# Patient Record
Sex: Female | Born: 1945 | Race: White | Hispanic: No | Marital: Married | Smoking: Never smoker
Health system: Southern US, Community
[De-identification: ages and names within clinical notes are randomized; demographics above are authoritative.]

## PROBLEM LIST (undated history)

## (undated) DIAGNOSIS — I10 Essential (primary) hypertension: Secondary | ICD-10-CM

## (undated) HISTORY — PX: JOINT REPLACEMENT: SHX530

---

## 2004-02-04 ENCOUNTER — Ambulatory Visit: Payer: Self-pay | Admitting: Psychology

## 2004-02-28 ENCOUNTER — Ambulatory Visit: Payer: Self-pay | Admitting: Psychology

## 2008-07-09 ENCOUNTER — Emergency Department (HOSPITAL_BASED_OUTPATIENT_CLINIC_OR_DEPARTMENT_OTHER): Admission: EM | Admit: 2008-07-09 | Discharge: 2008-07-09 | Payer: Self-pay | Admitting: Emergency Medicine

## 2010-09-23 LAB — DIFFERENTIAL
Basophils Absolute: 0.1 10*3/uL (ref 0.0–0.1)
Eosinophils Absolute: 0.2 10*3/uL (ref 0.0–0.7)
Eosinophils Relative: 2 % (ref 0–5)
Lymphocytes Relative: 38 % (ref 12–46)
Monocytes Absolute: 0.4 10*3/uL (ref 0.1–1.0)

## 2010-09-23 LAB — URINE CULTURE

## 2010-09-23 LAB — URINALYSIS, ROUTINE W REFLEX MICROSCOPIC
Ketones, ur: 15 mg/dL — AB
Nitrite: POSITIVE — AB
Protein, ur: >300 mg/dL — AB
Urobilinogen, UA: 1 mg/dL (ref 0.0–1.0)

## 2010-09-23 LAB — BASIC METABOLIC PANEL
BUN: 13 mg/dL (ref 6–23)
CO2: 28 mEq/L (ref 19–32)
Chloride: 102 mEq/L (ref 96–112)
GFR calc non Af Amer: >60 mL/min (ref >60)
Glucose, Bld: 102 mg/dL — ABNORMAL HIGH (ref 70–99)
Potassium: 4.2 mEq/L (ref 3.5–5.1)
Sodium: 141 mEq/L (ref 135–145)

## 2010-09-23 LAB — CBC
HCT: 39.2 % (ref 36.0–46.0)
Hemoglobin: 13.6 g/dL (ref 12.0–15.0)
MCV: 93.7 fL (ref 78.0–100.0)
RDW: 12.6 % (ref 11.5–15.5)

## 2010-10-27 ENCOUNTER — Emergency Department (INDEPENDENT_AMBULATORY_CARE_PROVIDER_SITE_OTHER): Payer: Managed Care, Other (non HMO)

## 2010-10-27 ENCOUNTER — Emergency Department (HOSPITAL_BASED_OUTPATIENT_CLINIC_OR_DEPARTMENT_OTHER)
Admission: EM | Admit: 2010-10-27 | Discharge: 2010-10-27 | Disposition: A | Discharge status: Home / Self Care | Payer: Managed Care, Other (non HMO) | Attending: Emergency Medicine | Admitting: Emergency Medicine

## 2010-10-27 DIAGNOSIS — M25579 Pain in unspecified ankle and joints of unspecified foot: Secondary | ICD-10-CM

## 2010-10-27 DIAGNOSIS — W098XXA Fall on or from other playground equipment, initial encounter: Secondary | ICD-10-CM

## 2010-10-27 DIAGNOSIS — W1789XA Other fall from one level to another, initial encounter: Secondary | ICD-10-CM | POA: Insufficient documentation

## 2010-10-27 DIAGNOSIS — G4730 Sleep apnea, unspecified: Secondary | ICD-10-CM | POA: Insufficient documentation

## 2010-10-27 DIAGNOSIS — F329 Major depressive disorder, single episode, unspecified: Secondary | ICD-10-CM | POA: Insufficient documentation

## 2010-10-27 DIAGNOSIS — I10 Essential (primary) hypertension: Secondary | ICD-10-CM | POA: Insufficient documentation

## 2010-10-27 DIAGNOSIS — S93409A Sprain of unspecified ligament of unspecified ankle, initial encounter: Secondary | ICD-10-CM | POA: Insufficient documentation

## 2010-10-27 DIAGNOSIS — F3289 Other specified depressive episodes: Secondary | ICD-10-CM | POA: Insufficient documentation

## 2011-06-11 ENCOUNTER — Other Ambulatory Visit: Payer: Self-pay

## 2011-06-11 ENCOUNTER — Emergency Department (HOSPITAL_BASED_OUTPATIENT_CLINIC_OR_DEPARTMENT_OTHER)
Admission: EM | Admit: 2011-06-11 | Discharge: 2011-06-12 | Disposition: A | Discharge status: Home / Self Care | Payer: Managed Care, Other (non HMO) | Attending: Emergency Medicine | Admitting: Emergency Medicine

## 2011-06-11 ENCOUNTER — Emergency Department (HOSPITAL_COMMUNITY): Payer: Managed Care, Other (non HMO)

## 2011-06-11 ENCOUNTER — Encounter: Payer: Self-pay | Admitting: *Deleted

## 2011-06-11 ENCOUNTER — Emergency Department (INDEPENDENT_AMBULATORY_CARE_PROVIDER_SITE_OTHER): Payer: Managed Care, Other (non HMO)

## 2011-06-11 DIAGNOSIS — R110 Nausea: Secondary | ICD-10-CM

## 2011-06-11 DIAGNOSIS — F3289 Other specified depressive episodes: Secondary | ICD-10-CM | POA: Insufficient documentation

## 2011-06-11 DIAGNOSIS — Z79899 Other long term (current) drug therapy: Secondary | ICD-10-CM | POA: Insufficient documentation

## 2011-06-11 DIAGNOSIS — R930 Abnormal findings on diagnostic imaging of skull and head, not elsewhere classified: Secondary | ICD-10-CM

## 2011-06-11 DIAGNOSIS — I10 Essential (primary) hypertension: Secondary | ICD-10-CM | POA: Insufficient documentation

## 2011-06-11 DIAGNOSIS — R42 Dizziness and giddiness: Secondary | ICD-10-CM

## 2011-06-11 DIAGNOSIS — F329 Major depressive disorder, single episode, unspecified: Secondary | ICD-10-CM | POA: Insufficient documentation

## 2011-06-11 HISTORY — DX: Essential (primary) hypertension: I10

## 2011-06-11 LAB — DIFFERENTIAL
Basophils Relative: 0 % (ref 0–1)
Eosinophils Relative: 0 % (ref 0–5)
Lymphocytes Relative: 17 % (ref 12–46)
Neutrophils Relative %: 79 % — ABNORMAL HIGH (ref 43–77)

## 2011-06-11 LAB — BASIC METABOLIC PANEL
Chloride: 102 mEq/L (ref 96–112)
GFR calc Af Amer: >90 mL/min (ref >90)
GFR calc non Af Amer: >90 mL/min (ref >90)
Glucose, Bld: 145 mg/dL — ABNORMAL HIGH (ref 70–99)
Potassium: 4.2 mEq/L (ref 3.5–5.1)
Sodium: 141 mEq/L (ref 135–145)

## 2011-06-11 LAB — CBC
Hemoglobin: 14.2 g/dL (ref 12.0–15.0)
RBC: 4.54 MIL/uL (ref 3.87–5.11)

## 2011-06-11 MED ORDER — MECLIZINE HCL 25 MG PO TABS: 25.0000 mg | ORAL_TABLET | Freq: Three times a day (TID) | ORAL | Status: AC | PRN

## 2011-06-11 MED ORDER — LORAZEPAM 2 MG/ML IJ SOLN
1.0000 mg | Freq: Once | INTRAMUSCULAR | Status: AC
  Administered 2011-06-11: 1 mg via INTRAVENOUS
  Filled 2011-06-11: qty 1

## 2011-06-11 MED ORDER — ONDANSETRON HCL 4 MG/2ML IJ SOLN
4.0000 mg | Freq: Once | INTRAMUSCULAR | Status: AC
  Administered 2011-06-11: 4 mg via INTRAVENOUS
  Filled 2011-06-11: qty 2

## 2011-06-11 MED ORDER — SODIUM CHLORIDE 0.9 % IV BOLUS (SEPSIS)
1000.0000 mL | Freq: Once | INTRAVENOUS | Status: AC
  Administered 2011-06-11: 1000 mL via INTRAVENOUS

## 2011-06-11 MED ORDER — MECLIZINE HCL 25 MG PO TABS
25.0000 mg | ORAL_TABLET | Freq: Once | ORAL | Status: AC
  Administered 2011-06-11: 25 mg via ORAL
  Filled 2011-06-11: qty 1

## 2011-06-11 NOTE — ED Notes (Signed)
Pt. Reports not eating normal and having no appetite.

## 2011-06-11 NOTE — ED Notes (Signed)
Pt. Reports being treated for an upper resp. Infection twice in Dec.

## 2011-06-11 NOTE — ED Notes (Signed)
Pt. Husband at bedside.

## 2011-06-11 NOTE — ED Notes (Signed)
Patient transported to MRI 

## 2011-06-11 NOTE — ED Notes (Signed)
CBG with EMS is 126

## 2011-06-11 NOTE — ED Notes (Signed)
Pt stated that she is anxious about the MRI. Denise Blazing, NP stated to give her 1mg  Ativan IV prior to MRI. Will continue to monitor.

## 2011-06-11 NOTE — ED Notes (Signed)
[facility] is here for the Pt.

## 2011-06-11 NOTE — ED Notes (Signed)
Family at bedside. 

## 2011-06-11 NOTE — ED Notes (Signed)
Pt. Has gone for CT scan

## 2011-06-11 NOTE — ED Notes (Signed)
Pt. Reports feeling better after having some crackers and cola.

## 2011-06-11 NOTE — ED Provider Notes (Signed)
Patient transferred in from [facility] for MRI to r/o stroke. CT performed earlier today had questionable finding in right occipital lobe.  Patient resting comfortably with family at bedside.  Currently symptom free.  Lungs CTA bilaterally.S1/S2, RRR, no murmur. Abdomen soft, bowel sounds present.  MOEX4, equal grip strength b/l.   11:50 PM  MRI results reviewed and discussed with patient.  No evidence of stroke.  Patient will be discharged home with rx for meclizine and follow-up with her PCP in [facility].    Jimmye Norman, NP 06/11/11 2351

## 2011-06-11 NOTE — ED Notes (Signed)
Returned from mri 

## 2011-06-11 NOTE — ED Notes (Signed)
Pt. Has been seen by EDP

## 2011-06-11 NOTE — ED Notes (Signed)
NP, Katrinka Blazing stated that  It was okay for patient to have saltines and a drink. Both given to pt.

## 2011-06-11 NOTE — ED Provider Notes (Addendum)
History     CSN: [id number]  Arrival date & time 06/11/11  1615   First MD Initiated Contact with Patient 06/11/11 1657      Chief Complaint  Patient presents with  . Nausea  . Dizziness    (Consider location/radiation/quality/duration/timing/severity/associated sxs/prior treatment) HPI  65yoF h/o depression, hypertension present with lightheadedness. The patient states she woke up this morning feeling lightheaded. She denies vertigo. She complains of nausea. She states that sometimes it is worse with head movement and other times it is not. She states that prior to arrival she had a presyncopal episode. She did not actually pass out. She denies recent head trauma or injury. She denies numbness, tingling, weakness of her extremity. She does not feel off balance. She denies fevers, chills, neck stiffness. She was recently treated for bronchitis. She denies pain in her ears. She denies chest pain, shortness of breath, abdominal pain. She is not having diarrhea or constipation. Her last bowel movement was 2 days ago. Per EMS her blood glucose was 126 prior to arrival. The patient does not have a history of cancer but she does report a 10-15 pound weight loss over the past 4 months. There've been no night sweats. No h/o similar events. States she did not eat today.   Past Medical History  Diagnosis Date  . Hypertension     Past Surgical History  Procedure Date  . Joint replacement     R knee    No family history on file.  History  Substance Use Topics  . Smoking status: Not on file  . Smokeless tobacco: Not on file  . Alcohol Use:     OB History    Grav Para Term Preterm Abortions TAB SAB Ect Mult Living                  Review of Systems  All other systems reviewed and are negative.  except as noted HPI   Allergies  Review of patient's allergies indicates no known allergies.  Home Medications   Current Outpatient Rx  Name Route Sig Dispense Refill  . ALPRAZOLAM  0.5 MG PO TABS Oral Take 0.5 mg by mouth at bedtime as needed. For sleep    . AMLODIPINE BESYLATE 5 MG PO TABS Oral Take 5 mg by mouth daily.      . DESVENLAFAXINE SUCCINATE ER 50 MG PO TB24 Oral Take 50 mg by mouth daily.      Marland Kitchen GABAPENTIN 100 MG PO CAPS Oral Take 100 mg by mouth daily.     Marland Kitchen OMEPRAZOLE 20 MG PO CPDR Oral Take 20 mg by mouth daily.      Marland Kitchen ONDANSETRON 4 MG PO TBDP Oral Take 4 mg by mouth every 8 (eight) hours as needed. For nausea    . PREDNISONE (PAK) 10 MG PO TABS Oral Take 10 mg by mouth daily.      Marland Kitchen ROPINIROLE HCL 0.5 MG PO TABS Oral Take 0.5 mg by mouth daily as needed. For restless legs    . TRAMADOL HCL 50 MG PO TABS Oral Take 50 mg by mouth every 6 (six) hours as needed. For pain      BP 158/89  Pulse 69  Temp(Src) 98.1 F (36.7 C) (Oral)  Resp 18  Ht 5\' 3"  (1.6 m)  Wt 195 lb (88.451 kg)  BMI 34.54 kg/m2  SpO2 100%  Physical Exam  Nursing note and vitals reviewed. Constitutional: She is oriented to person, place, and time. She  appears well-developed.  HENT:  Head: Atraumatic.  Mouth/Throat: Oropharynx is clear and moist.       TM clear b/l  Eyes: Conjunctivae and EOM are normal. Pupils are equal, round, and reactive to light.  Neck: Normal range of motion. Neck supple.  Cardiovascular: Normal rate, regular rhythm, normal heart sounds and intact distal pulses.   Pulmonary/Chest: Effort normal and breath sounds normal. No respiratory distress. She has no wheezes. She has no rales.  Abdominal: Soft. She exhibits no distension. There is no tenderness. There is no rebound and no guarding.  Musculoskeletal: Normal range of motion.  Neurological: She is alert and oriented to person, place, and time. No cranial nerve deficit. She exhibits normal muscle tone. Coordination normal.       Strength 5/5 all extremities No pronator drift No facial droop  F--> n intact H--> shin intact Romberg NEGATIVE  Skin: Skin is warm and dry. No rash noted.  Psychiatric: She  has a normal mood and affect.    Date: 06/11/2011  Rate: 59  Rhythm: sinus bradycardia  QRS Axis: normal  Intervals: normal  ST/T Wave abnormalities: normal  Conduction Disutrbances:none  Narrative Interpretation:   Old EKG Reviewed: none available  ED Course  Procedures (including critical care time)  Labs Reviewed  BASIC METABOLIC PANEL - Abnormal; Notable for the following:    Glucose, Bld 145 (*)    All other components within normal limits  CBC - Abnormal; Notable for the following:    Platelets 417 (*)    All other components within normal limits  DIFFERENTIAL - Abnormal; Notable for the following:    Neutrophils Relative 79 (*)    All other components within normal limits  URINALYSIS, ROUTINE W REFLEX MICROSCOPIC   Ct Head Wo Contrast  06/11/2011  *RADIOLOGY REPORT*  Clinical Data: Vertigo, nausea  CT HEAD WITHOUT CONTRAST  Technique:  Contiguous axial images were obtained from the base of the skull through the vertex without contrast.  Comparison: None  Findings: Minimal atrophy. Normal ventricular morphology. No midline shift or mass effect. Streak artifacts from skull base traverse pons and brain stem. Questionable area of low attenuation at right occipital lobe, could represent artifact but subtle infarct not excluded. No intracranial hemorrhage, mass lesion or additional areas of potential infarction. Small amount of fluid within the maxillary sinuses bilaterally. Minimal scattered mucosal thickening ethmoid air cells. No acute osseous findings.  IMPRESSION: Question artifact at right occipital lobe versus subtle infarct. No other intracranial abnormalities identified.  Original Report Authenticated By: Lollie Marrow, M.D.     1. Lightheadedness   2. Nausea     MDM  The patient presents with lightheadedness and nausea. Her neurological exam is completely intact. No cerebellar findings. She does report recent weight loss over the past few months without trying. CT head  was ordered to evaluate for mass lesion. The results are as above. Question artifact versus subtle infarct of right occipital lobe. I hvae a very low suspicion that this is causing the patient's symptoms however we'll send over to [facility] for further evaluation with an MRI of her head. The patient is actually feeling better with IV fluids, meclizine. She passed a bedside swallow test and is tolerating by mouth. If her MRI is negative anticipate she can be discharged home with primary care followup, meclizine for possible peripheral etiology. May be related to her recent likely viral URI/bronchitis.  Discussed transfer with Dr. Juleen China at [facility]. D/W CDU NP--aware of transfer and  plan.    Forbes Cellar, MD 06/11/11 2057

## 2011-06-11 NOTE — ED Notes (Signed)
Pt. Has husband here with her and was noted with no active vomiting

## 2011-06-11 NOTE — ED Notes (Addendum)
Pt  Just transferred per EMS from [facility]. Per pt she has been lightheaded and nauseated all day. She started having a cold sensation, so she called EMS to come take her to the hospital. She did not have any SOB, CP, or neurological deficits. Per EMS, when she arrived the [facility], a CT was performed. The CT recommended that she receive a MRI, so that is why she was transferred here. Currently, pt is not in any respiratory distress and does not have any neurological deficits. Pt is still complaining of dizziness with standing. Will continue to monitor.

## 2011-06-20 NOTE — ED Provider Notes (Signed)
Medical screening examination/treatment/procedure(s) were performed by non-physician practitioner and as supervising physician I was immediately available for consultation/collaboration.  Raeford Razor, MD 06/20/11 2244

## 2014-01-06 ENCOUNTER — Emergency Department (HOSPITAL_BASED_OUTPATIENT_CLINIC_OR_DEPARTMENT_OTHER)
Admission: EM | Admit: 2014-01-06 | Discharge: 2014-01-06 | Discharge status: Against Medical Advice | Payer: Managed Care, Other (non HMO)

## 2017-09-12 ENCOUNTER — Encounter (HOSPITAL_BASED_OUTPATIENT_CLINIC_OR_DEPARTMENT_OTHER): Payer: Self-pay | Admitting: Adult Health

## 2017-09-12 ENCOUNTER — Other Ambulatory Visit: Payer: Self-pay

## 2017-09-12 ENCOUNTER — Emergency Department (HOSPITAL_BASED_OUTPATIENT_CLINIC_OR_DEPARTMENT_OTHER)
Admission: EM | Admit: 2017-09-12 | Discharge: 2017-09-12 | Disposition: A | Discharge status: Home / Self Care | Payer: Medicare Other | Attending: Emergency Medicine | Admitting: Emergency Medicine

## 2017-09-12 DIAGNOSIS — N390 Urinary tract infection, site not specified: Secondary | ICD-10-CM | POA: Diagnosis not present

## 2017-09-12 DIAGNOSIS — Z79899 Other long term (current) drug therapy: Secondary | ICD-10-CM | POA: Diagnosis not present

## 2017-09-12 DIAGNOSIS — Z96651 Presence of right artificial knee joint: Secondary | ICD-10-CM | POA: Insufficient documentation

## 2017-09-12 DIAGNOSIS — I10 Essential (primary) hypertension: Secondary | ICD-10-CM | POA: Insufficient documentation

## 2017-09-12 DIAGNOSIS — R319 Hematuria, unspecified: Secondary | ICD-10-CM | POA: Diagnosis present

## 2017-09-12 LAB — URINALYSIS, ROUTINE W REFLEX MICROSCOPIC
BILIRUBIN URINE: NEGATIVE
GLUCOSE, UA: 100 mg/dL — AB
KETONES UR: 15 mg/dL — AB
Nitrite: POSITIVE — AB
Specific Gravity, Urine: 1.02 (ref 1.005–1.030)
pH: 6.5 (ref 5.0–8.0)

## 2017-09-12 LAB — URINALYSIS, MICROSCOPIC (REFLEX)

## 2017-09-12 MED ORDER — CEPHALEXIN 500 MG PO CAPS: 500.0000 mg | ORAL_CAPSULE | Freq: Two times a day (BID) | ORAL | 0 refills | Status: AC

## 2017-09-12 NOTE — ED Provider Notes (Signed)
[facility] EMERGENCY DEPARTMENT Provider Note   CSN: [id number] Arrival date & time: 09/12/17  2013     History   Chief Complaint Chief Complaint  Patient presents with  . Urinary Tract Infection    HPI Denise Little is a 72 y.o. female.  Patient is a 72 year old female who presents with symptoms consistent with her prior urinary tract infections.  She has a 2-day history of some pressure in her lower abdomen and pressure on urination.  She also notes some hematuria which she has had consistently with her prior urinary tract infections.  She denies any flank pain.  No nausea or vomiting.  No fevers.  No pain on urination.  She states that she typically gets about 2 UTIs a year.  She is currently followed by her primary care physician.     Past Medical History:  Diagnosis Date  . Hypertension     There are no active problems to display for this patient.   Past Surgical History:  Procedure Laterality Date  . JOINT REPLACEMENT     R knee     OB History   None      Home Medications    Prior to Admission medications   Medication Sig Start Date End Date Taking? Authorizing Provider  ALPRAZolam Prudy Feeler(XANAX) 0.5 MG tablet Take 0.5 mg by mouth at bedtime as needed. For sleep    [provider]  amLODipine (NORVASC) 5 MG tablet Take 5 mg by mouth daily.      [provider]  cephALEXin (KEFLEX) 500 MG capsule Take 1 capsule (500 mg total) by mouth 2 (two) times daily. 09/12/17   Rolan BuccoBelfi, Victoriano Campion, MD  desvenlafaxine (PRISTIQ) 50 MG 24 hr tablet Take 50 mg by mouth daily.      [provider]  gabapentin (NEURONTIN) 100 MG capsule Take 100 mg by mouth daily.     [provider]  omeprazole (PRILOSEC) 20 MG capsule Take 20 mg by mouth daily.      [provider]  ondansetron (ZOFRAN-ODT) 4 MG disintegrating tablet Take 4 mg by mouth every 8 (eight) hours as needed. For nausea    [provider]  predniSONE (STERAPRED  UNI-PAK) 10 MG tablet Take 10 mg by mouth daily.      [provider]  rOPINIRole (REQUIP) 0.5 MG tablet Take 0.5 mg by mouth daily as needed. For restless legs    [provider]  traMADol (ULTRAM) 50 MG tablet Take 50 mg by mouth every 6 (six) hours as needed. For pain    [provider]    Family History History reviewed. No pertinent family history.  Social History Social History   Tobacco Use  . Smoking status: Not on file  Substance Use Topics  . Alcohol use: Not on file  . Drug use: Not on file     Allergies   Patient has no known allergies.   Review of Systems Review of Systems  Constitutional: Negative for chills, diaphoresis, fatigue and fever.  HENT: Negative for congestion, rhinorrhea and sneezing.   Eyes: Negative.   Respiratory: Negative for cough, chest tightness and shortness of breath.   Cardiovascular: Negative for chest pain and leg swelling.  Gastrointestinal: Negative for abdominal pain, blood in stool, diarrhea, nausea and vomiting.  Genitourinary: Positive for hematuria and urgency. Negative for difficulty urinating, flank pain and frequency.  Musculoskeletal: Negative for arthralgias and back pain.  Skin: Negative for rash.  Neurological: Negative for dizziness, speech  difficulty, weakness, numbness and headaches.     Physical Exam Updated Vital Signs BP (!) 163/74 (BP Location: Right Arm)   Pulse 79   Temp 98.2 F (36.8 C) (Oral)   Resp 18   SpO2 98%   Physical Exam  Constitutional: She is oriented to person, place, and time. She appears well-developed and well-nourished.  HENT:  Head: Normocephalic and atraumatic.  Eyes: Pupils are equal, round, and reactive to light.  Neck: Normal range of motion. Neck supple.  Cardiovascular: Normal rate, regular rhythm and normal heart sounds.  Pulmonary/Chest: Effort normal and breath sounds normal. No respiratory distress. She has no wheezes. She has no rales. She exhibits  no tenderness.  Abdominal: Soft. Bowel sounds are normal. There is tenderness (Mild tenderness to the suprapubic area). There is no rebound and no guarding.  Musculoskeletal: Normal range of motion. She exhibits no edema.  Lymphadenopathy:    She has no cervical adenopathy.  Neurological: She is alert and oriented to person, place, and time.  Skin: Skin is warm and dry. No rash noted.  Psychiatric: She has a normal mood and affect.     ED Treatments / Results  Labs (all labs ordered are listed, but only abnormal results are displayed) Labs Reviewed  URINALYSIS, ROUTINE W REFLEX MICROSCOPIC - Abnormal; Notable for the following components:      Result Value   Color, Urine RED (*)    APPearance CLOUDY (*)    Glucose, UA 100 (*)    Hgb urine dipstick LARGE (*)    Ketones, ur 15 (*)    Protein, ur >300 (*)    Nitrite POSITIVE (*)    Leukocytes, UA SMALL (*)    All other components within normal limits  URINALYSIS, MICROSCOPIC (REFLEX) - Abnormal; Notable for the following components:   Bacteria, UA MANY (*)    Squamous Epithelial / LPF 0-5 (*)    All other components within normal limits  URINE CULTURE    EKG None  Radiology No results found.  Procedures Procedures (including critical care time)  Medications Ordered in ED Medications - No data to display   Initial Impression / Assessment and Plan / ED Course  I have reviewed the triage vital signs and the nursing notes.  Pertinent labs & imaging results that were available during my care of the patient were reviewed by me and considered in my medical decision making (see chart for details).     Patient's urine is consistent with infection.  There is hematuria but she does not have clinical symptoms that would be more consistent with renal colic.  I reviewed her prior cultures and her last one grew out E. coli which was pansensitive.  She was started on Keflex.  Her urine culture was sent.  She was encouraged to  follow-up with her PCP.  Return precautions were given.  Final Clinical Impressions(s) / ED Diagnoses   Final diagnoses:  Lower urinary tract infectious disease    ED Discharge Orders        Ordered    cephALEXin (KEFLEX) 500 MG capsule  2 times daily     09/12/17 2243       Rolan Bucco, MD 09/12/17 2256

## 2017-09-12 NOTE — ED Triage Notes (Addendum)
Presents with hematuria that began one hour ago. SHe reprots hx of UTIs and this feels the same. Denies CVA tenderness. Denies fevers. Endorses dysuria and frequency

## 2017-09-15 LAB — URINE CULTURE: Culture: >=100000 — AB

## 2017-09-16 ENCOUNTER — Telehealth: Payer: Self-pay | Admitting: *Deleted

## 2017-09-16 NOTE — Telephone Encounter (Signed)
Post ED Visit - Positive Culture Follow-up  Culture report reviewed by antimicrobial stewardship pharmacist:  []  Enzo BiNathan Batchelder, Pharm.D. []  Celedonio MiyamotoJeremy Frens, Pharm.D., BCPS AQ-ID []  Garvin FilaMike Maccia, Pharm.D., BCPS []  Georgina PillionElizabeth Martin, Pharm.D., BCPS []  Wofford HeightsMinh Pham, VermontPharm.D., BCPS, AAHIVP []  Estella HuskMichelle Turner, Pharm.D., BCPS, AAHIVP []  Lysle Pearlachel Rumbarger, PharmD, BCPS []  Blake DivineShannon Parkey, PharmD []  Pollyann SamplesAndy Johnston, PharmD, BCPS Sharin MonsEmily Sinclair, PharmD  Positive urine culture Treated with Cephalexin, organism sensitive to the same and no further patient follow-up is required at this time.  Virl AxeRobertson, Reginaldo Hazard [facility] Hospitalalley 09/16/2017, 11:03 AM

## 2018-05-05 ENCOUNTER — Emergency Department (HOSPITAL_BASED_OUTPATIENT_CLINIC_OR_DEPARTMENT_OTHER): Payer: Medicare Other

## 2018-05-05 ENCOUNTER — Other Ambulatory Visit: Payer: Self-pay

## 2018-05-05 ENCOUNTER — Encounter (HOSPITAL_BASED_OUTPATIENT_CLINIC_OR_DEPARTMENT_OTHER): Payer: Self-pay | Admitting: *Deleted

## 2018-05-05 ENCOUNTER — Emergency Department (HOSPITAL_BASED_OUTPATIENT_CLINIC_OR_DEPARTMENT_OTHER)
Admission: EM | Admit: 2018-05-05 | Discharge: 2018-05-05 | Disposition: A | Discharge status: Home / Self Care | Payer: Medicare Other | Attending: Emergency Medicine | Admitting: Emergency Medicine

## 2018-05-05 DIAGNOSIS — I10 Essential (primary) hypertension: Secondary | ICD-10-CM | POA: Diagnosis not present

## 2018-05-05 DIAGNOSIS — R05 Cough: Secondary | ICD-10-CM | POA: Insufficient documentation

## 2018-05-05 DIAGNOSIS — Z96651 Presence of right artificial knee joint: Secondary | ICD-10-CM | POA: Diagnosis not present

## 2018-05-05 DIAGNOSIS — R059 Cough, unspecified: Secondary | ICD-10-CM

## 2018-05-05 DIAGNOSIS — Z79899 Other long term (current) drug therapy: Secondary | ICD-10-CM | POA: Diagnosis not present

## 2018-05-05 MED ORDER — BENZONATATE 100 MG PO CAPS: 100.0000 mg | ORAL_CAPSULE | Freq: Three times a day (TID) | ORAL | 0 refills | Status: AC

## 2018-05-05 MED ORDER — FLUTICASONE PROPIONATE 50 MCG/ACT NA SUSP: 1.0000 | Freq: Every day | NASAL | 0 refills | Status: AC

## 2018-05-05 NOTE — ED Triage Notes (Signed)
Cough since October. Her MD thinks she has reflux. She was referred to an ENT. States she is scheduled to go tomorrow but has to work and can't go. States she needs a cough medication for night time.

## 2018-05-05 NOTE — ED Provider Notes (Signed)
[facility] EMERGENCY DEPARTMENT Provider Note   CSN: [id number] Arrival date & time: 05/05/18  1718     History   Chief Complaint Chief Complaint  Patient presents with  . Cough    HPI Denise Little is a 72 y.o. female with a hx of HTN who presents to the ED with complaints of cough x 2 months. Patient states cough is dry, occasionally with production of clear sputum. Constant since onset. Seen by PCP- queried GERD as etiology- increased omeprazole dose without improvement, ultimately referred to ENT, has appointment tomorrow, had to cancel due to needing to work, she plans to reschedule. Tried OTC cough medicine also without relief. Denies fever, chills, congestion, ear pain, sore throat, dyspnea, or chest pain. Denies orthopnea/PND. She states she is here for cough medicine to help her sleep.   HPI  Past Medical History:  Diagnosis Date  . Hypertension     There are no active problems to display for this patient.   Past Surgical History:  Procedure Laterality Date  . JOINT REPLACEMENT     R knee     OB History   None      Home Medications    Prior to Admission medications   Medication Sig Start Date End Date Taking? Authorizing Provider  ALPRAZolam Prudy Feeler) 0.5 MG tablet Take 0.5 mg by mouth at bedtime as needed. For sleep   Yes [provider]  amLODipine (NORVASC) 5 MG tablet Take 5 mg by mouth daily.     Yes [provider]  atorvastatin (LIPITOR) 10 MG tablet Take 10 mg by mouth daily.   Yes [provider]  celecoxib (CELEBREX) 200 MG capsule Take 200 mg by mouth 2 (two) times daily.   Yes [provider]  cyclobenzaprine (FLEXERIL) 5 MG tablet Take 5 mg by mouth 3 (three) times daily as needed for muscle spasms.   Yes [provider]  diclofenac sodium (VOLTAREN) 1 % GEL Apply topically 4 (four) times daily.   Yes [provider]  DULoxetine (CYMBALTA) 60 MG capsule Take 60 mg by mouth daily.    Yes [provider]  gabapentin (NEURONTIN) 100 MG capsule Take 100 mg by mouth daily.    Yes [provider]  omeprazole (PRILOSEC) 20 MG capsule Take 20 mg by mouth daily.     Yes [provider]  rOPINIRole (REQUIP) 0.5 MG tablet Take 0.5 mg by mouth daily as needed. For restless legs   Yes [provider]  sertraline (ZOLOFT) 100 MG tablet Take 100 mg by mouth daily.   Yes [provider]  sulfamethoxazole-trimethoprim (BACTRIM DS,SEPTRA DS) 800-160 MG tablet Take 1 tablet by mouth 2 (two) times daily.   Yes [provider]  zolpidem (AMBIEN) 5 MG tablet Take 5 mg by mouth at bedtime as needed for sleep.   Yes [provider]  cephALEXin (KEFLEX) 500 MG capsule Take 1 capsule (500 mg total) by mouth 2 (two) times daily. 09/12/17   Rolan Bucco, MD  desvenlafaxine (PRISTIQ) 50 MG 24 hr tablet Take 50 mg by mouth daily.      [provider]  ondansetron (ZOFRAN-ODT) 4 MG disintegrating tablet Take 4 mg by mouth every 8 (eight) hours as needed. For nausea    [provider]  predniSONE (STERAPRED UNI-PAK) 10 MG tablet Take 10 mg by mouth daily.      [provider]  traMADol (ULTRAM) 50 MG tablet Take 50 mg by mouth every 6 (  six) hours as needed. For pain    [provider]    Family History No family history on file.  Social History Social History   Tobacco Use  . Smoking status: Never Smoker  . Smokeless tobacco: Never Used  Substance Use Topics  . Alcohol use: Yes  . Drug use: Never     Allergies   Patient has no known allergies.   Review of Systems Review of Systems  Constitutional: Negative for chills and fever.  HENT: Negative for congestion, ear discharge, ear pain, facial swelling, rhinorrhea, sore throat, trouble swallowing and voice change.   Respiratory: Positive for cough. Negative for shortness of breath, wheezing and stridor.   Cardiovascular: Negative for chest pain  and leg swelling.     Physical Exam Updated Vital Signs BP 119/73   Pulse 87   Temp 98.3 F (36.8 C) (Oral)   Resp 20   Ht 5\' 3"  (1.6 m)   Wt 97.5 kg   SpO2 97%   BMI 38.09 kg/m   Physical Exam  Constitutional: She appears well-developed and well-nourished.  Non-toxic appearance. No distress.  HENT:  Head: Normocephalic and atraumatic.  Right Ear: Tympanic membrane normal. Tympanic membrane is not perforated, not erythematous, not retracted and not bulging.  Left Ear: Tympanic membrane normal. Tympanic membrane is not perforated, not erythematous, not retracted and not bulging.  Nose: Mucosal edema present.  Mouth/Throat: Uvula is midline and oropharynx is clear and moist. No oropharyngeal exudate or posterior oropharyngeal erythema.  Eyes: Pupils are equal, round, and reactive to light. Conjunctivae are normal. Right eye exhibits no discharge. Left eye exhibits no discharge.  Neck: Normal range of motion. Neck supple.  Cardiovascular: Normal rate and regular rhythm.  No murmur heard. Pulmonary/Chest: Effort normal and breath sounds normal. No respiratory distress. She has no wheezes. She has no rhonchi. She has no rales.  Abdominal: Soft. She exhibits no distension. There is no tenderness.  Musculoskeletal: She exhibits no edema.  Lymphadenopathy:    She has no cervical adenopathy.  Neurological: She is alert.  Skin: Skin is warm and dry. No rash noted.  Psychiatric: She has a normal mood and affect. Her behavior is normal.  Nursing note and vitals reviewed.    ED Treatments / Results  Labs (all labs ordered are listed, but only abnormal results are displayed) Labs Reviewed - No data to display  EKG None  Radiology Dg Chest 2 View  Result Date: 05/05/2018 CLINICAL DATA:  Cough EXAM: CHEST - 2 VIEW COMPARISON:  04/19/2017 chest radiograph. FINDINGS: Stable cardiomediastinal silhouette with top-normal heart size. No pneumothorax. No pleural effusion. Lungs appear  clear, with no acute consolidative airspace disease and no pulmonary edema. IMPRESSION: No active cardiopulmonary disease. Electronically Signed   By: Delbert PhenixJason A Poff M.D.   On: 05/05/2018 17:37    Procedures Procedures (including critical care time)  Medications Ordered in ED Medications - No data to display   Initial Impression / Assessment and Plan / ED Course  I have reviewed the triage vital signs and the nursing notes.  Pertinent labs & imaging results that were available during my care of the patient were reviewed by me and considered in my medical decision making (see chart for details).   Patient presents to the ED for cough x 2 months. Seen by PCP- trialed increase in omeprazole and lifestyle changes without much relief, decision to refer to ENT- patient has appointment tomorrow which she cannot make and has plans to reschedule, states  she is here to get cough medicine to help her sleep. Nontoxic appearing, vitals WNL. Benign physical exam. Lungs CTA without signs of respiratory distress. CXR negative for infiltrate, pneumothorax, effusion/edema. No CXR findings, PND, orthopnea or pedal edema to raise concern for CHF as underlying causes. Trialed for GERD. Possible post nasal drip given some congestion on exam- will trial flonase and tessalon. PCP/ENT follow up. I discussed results, treatment plan, need for follow-up, and return precautions with the patient. Provided opportunity for questions, patient confirmed understanding and is in agreement with plan.    Final Clinical Impressions(s) / ED Diagnoses   Final diagnoses:  Cough    ED Discharge Orders         Ordered    fluticasone (FLONASE) 50 MCG/ACT nasal spray  Daily     05/05/18 1825    benzonatate (TESSALON) 100 MG capsule  Every 8 hours     05/05/18 1825           Cherly Anderson, PA-C 05/05/18 1826    Little, Ambrose Finland, MD 05/05/18 2351

## 2018-05-05 NOTE — Discharge Instructions (Signed)
You were seen in the emergency department today for cough.  Your chest x-ray was normal.  We are sending home with Flonase and Tessalon to help with your symptoms.  Flonase is a nasal spray that is a steroid to help dry up any nasal congestion or postnasal drip, please use 1 spray per nostril daily.  Tessalon is a medicine to help with cough, please take 1 to 2 tablets every 8 hours as needed for coughing.  We have prescribed you new medication(s) today. Discuss the medications prescribed today with your pharmacist as they can have adverse effects and interactions with your other medicines including over the counter and prescribed medications. Seek medical evaluation if you start to experience new or abnormal symptoms after taking one of these medicines, seek care immediately if you start to experience difficulty breathing, feeling of your throat closing, facial swelling, or rash as these could be indications of a more serious allergic reaction  Please follow-up with your primary care provider as well as the ENT provider you were scheduled to see within the next 1 week.  Return to the ER for new or worsening symptoms or any other concerns.

## 2019-07-17 IMAGING — DX DG CHEST 2V
2 series · 2 of 2 positions shown · non-contrast
Comparison: 04/19/2017 chest radiograph.

CLINICAL DATA: Cough

EXAM:
CHEST - 2 VIEW

[chest pa]
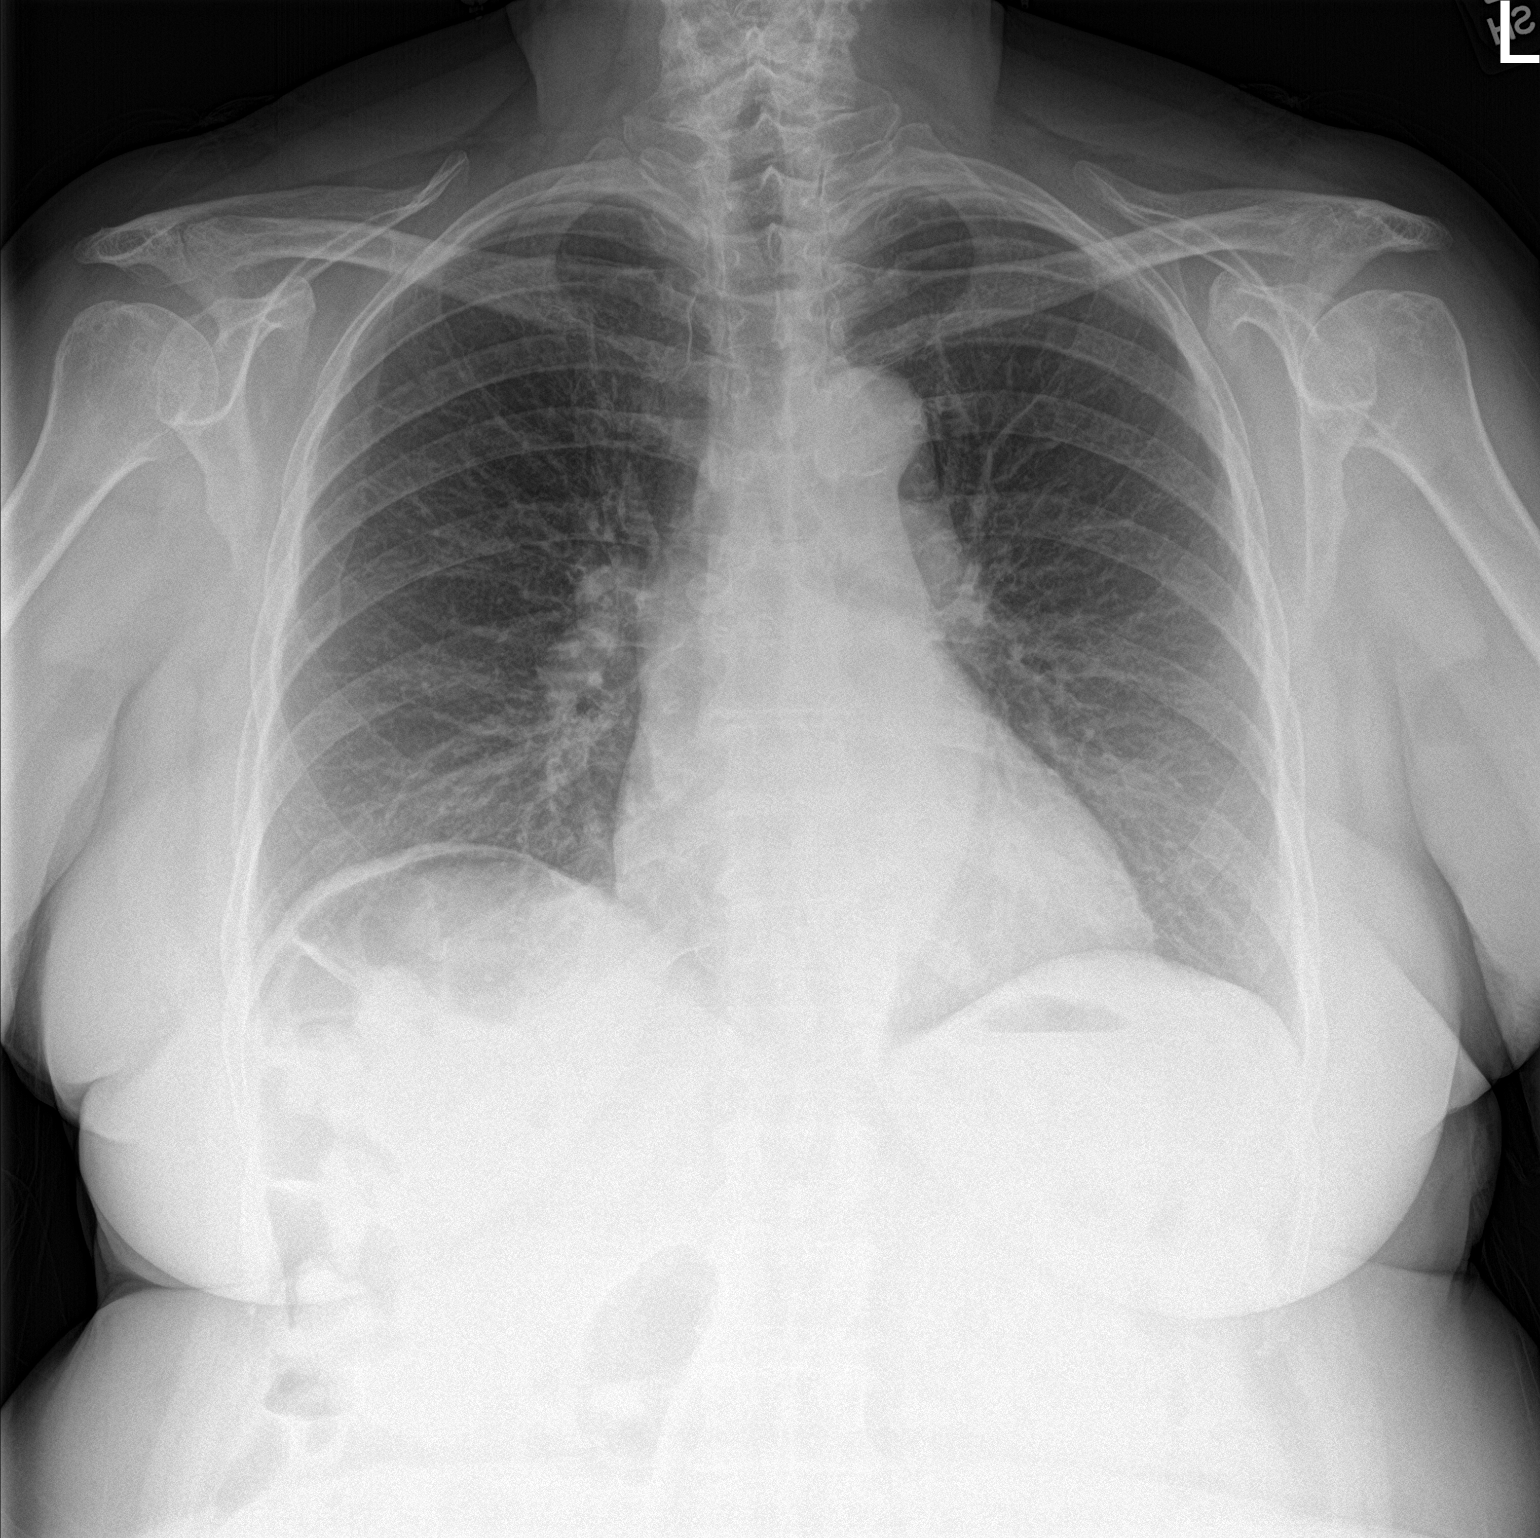

[chest lat]
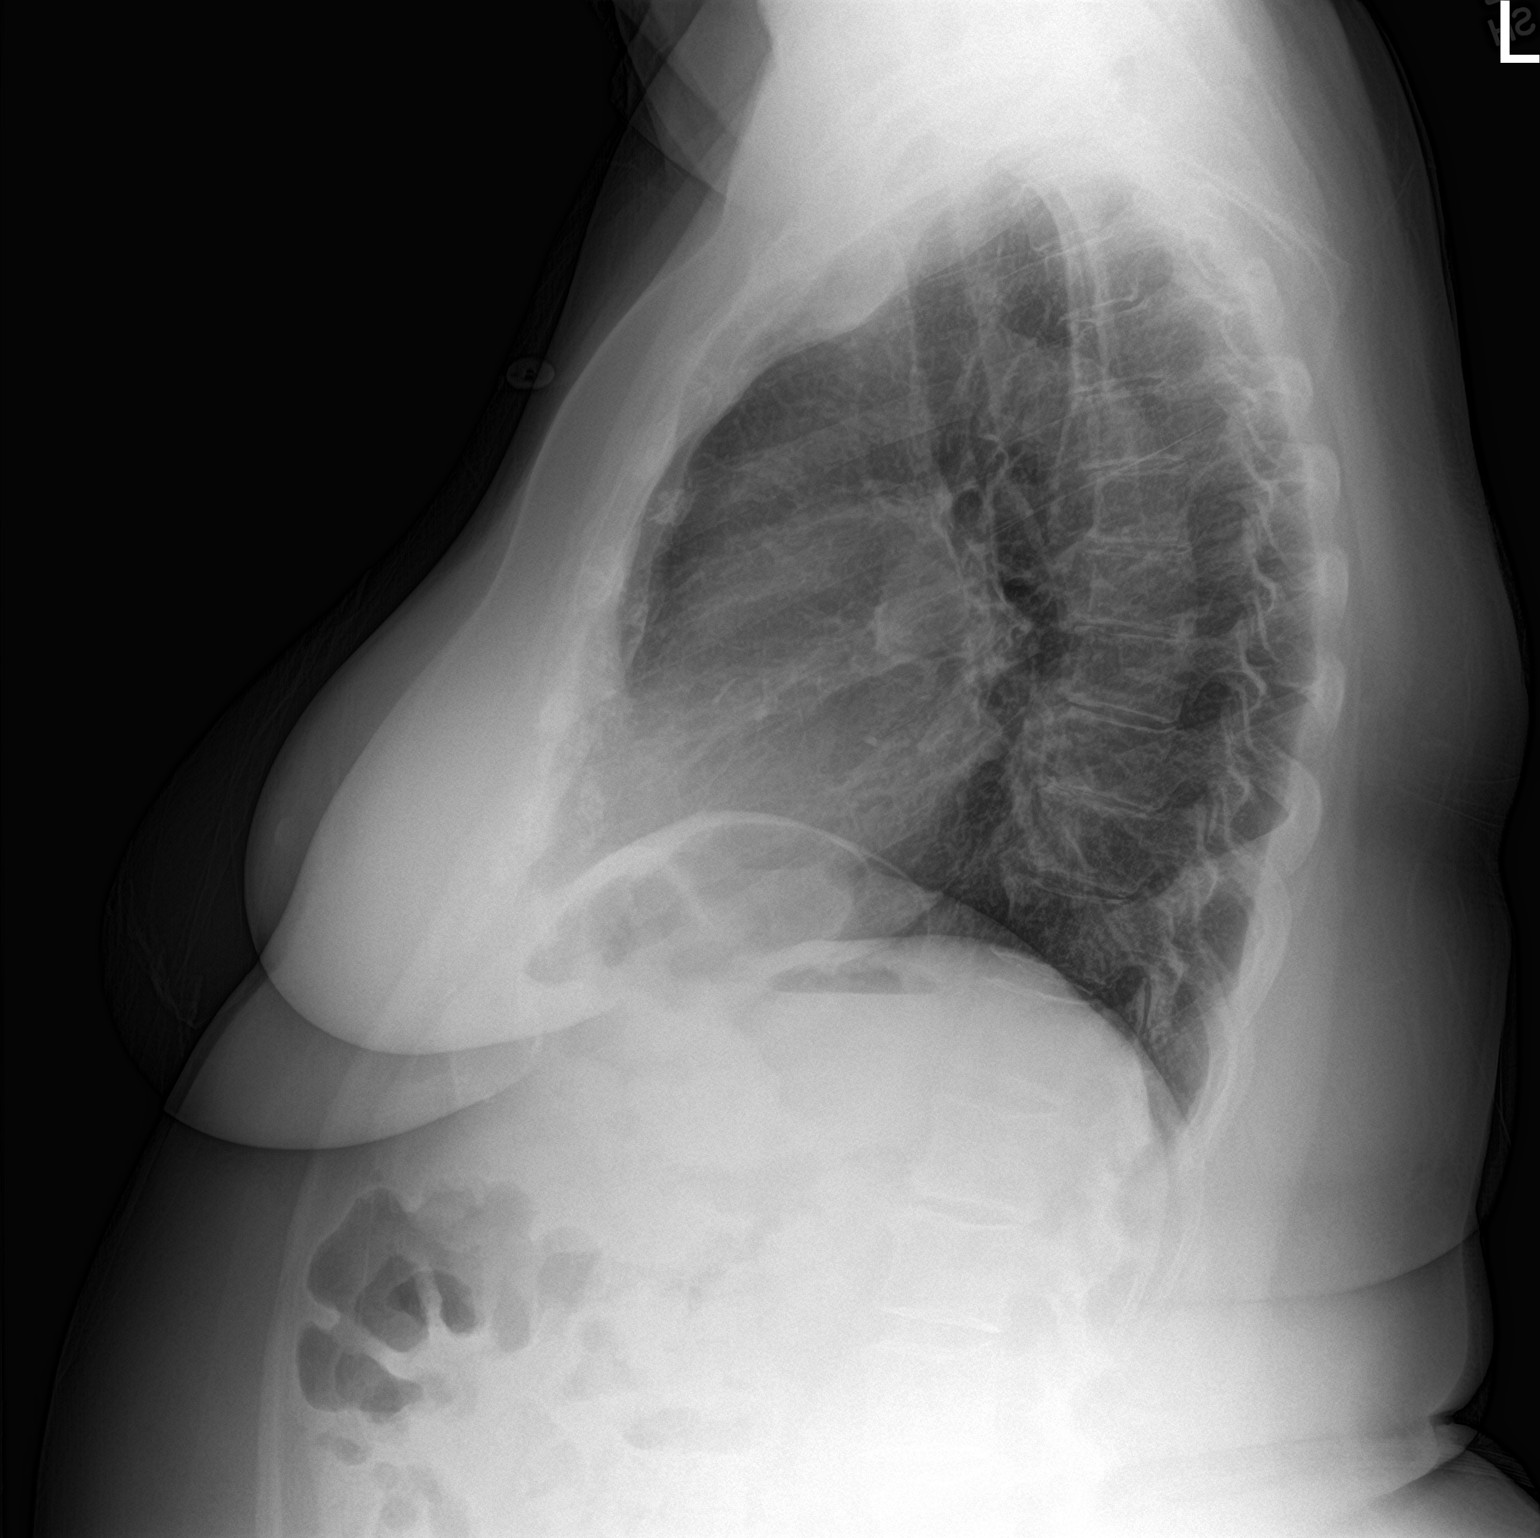

[2 of 2 positions shown; findings below may reference images not displayed]

FINDINGS: Stable cardiomediastinal silhouette with top-normal heart size. No
pneumothorax. No pleural effusion. Lungs appear clear, with no acute
consolidative airspace disease and no pulmonary edema.
IMPRESSION: No active cardiopulmonary disease.

## 2019-12-02 ENCOUNTER — Other Ambulatory Visit: Payer: Self-pay

## 2019-12-02 ENCOUNTER — Emergency Department (HOSPITAL_BASED_OUTPATIENT_CLINIC_OR_DEPARTMENT_OTHER): Payer: Medicare HMO

## 2019-12-02 ENCOUNTER — Encounter (HOSPITAL_BASED_OUTPATIENT_CLINIC_OR_DEPARTMENT_OTHER): Payer: Self-pay | Admitting: Emergency Medicine

## 2019-12-02 ENCOUNTER — Emergency Department (HOSPITAL_BASED_OUTPATIENT_CLINIC_OR_DEPARTMENT_OTHER)
Admission: EM | Admit: 2019-12-02 | Discharge: 2019-12-02 | Disposition: A | Discharge status: Home / Self Care | Payer: Medicare HMO | Attending: Emergency Medicine | Admitting: Emergency Medicine

## 2019-12-02 DIAGNOSIS — M79605 Pain in left leg: Secondary | ICD-10-CM

## 2019-12-02 DIAGNOSIS — R109 Unspecified abdominal pain: Secondary | ICD-10-CM | POA: Insufficient documentation

## 2019-12-02 DIAGNOSIS — I10 Essential (primary) hypertension: Secondary | ICD-10-CM | POA: Insufficient documentation

## 2019-12-02 DIAGNOSIS — Z79899 Other long term (current) drug therapy: Secondary | ICD-10-CM | POA: Insufficient documentation

## 2019-12-02 LAB — CBC WITH DIFFERENTIAL/PLATELET
Abs Immature Granulocytes: 0.04 10*3/uL (ref 0.00–0.07)
Basophils Absolute: 0 10*3/uL (ref 0.0–0.1)
Basophils Relative: 1 %
Eosinophils Absolute: 0.1 10*3/uL (ref 0.0–0.5)
Eosinophils Relative: 2 %
HCT: 39.7 % (ref 36.0–46.0)
Hemoglobin: 12.7 g/dL (ref 12.0–15.0)
Immature Granulocytes: 1 %
Lymphocytes Relative: 37 %
Lymphs Abs: 2.6 10*3/uL (ref 0.7–4.0)
MCH: 32.6 pg (ref 26.0–34.0)
MCHC: 32 g/dL (ref 30.0–36.0)
MCV: 102.1 fL — ABNORMAL HIGH (ref 80.0–100.0)
Monocytes Absolute: 0.5 10*3/uL (ref 0.1–1.0)
Monocytes Relative: 7 %
Neutro Abs: 3.8 10*3/uL (ref 1.7–7.7)
Neutrophils Relative %: 52 %
Platelets: 287 10*3/uL (ref 150–400)
RBC: 3.89 MIL/uL (ref 3.87–5.11)
RDW: 13.6 % (ref 11.5–15.5)
WBC: 7 10*3/uL (ref 4.0–10.5)
nRBC: 0 % (ref 0.0–0.2)

## 2019-12-02 LAB — COMPREHENSIVE METABOLIC PANEL
ALT: 44 U/L (ref 0–44)
AST: 32 U/L (ref 15–41)
Albumin: 4.3 g/dL (ref 3.5–5.0)
Alkaline Phosphatase: 90 U/L (ref 38–126)
Anion gap: 9 (ref 5–15)
BUN: 15 mg/dL (ref 8–23)
CO2: 29 mmol/L (ref 22–32)
Calcium: 9.3 mg/dL (ref 8.9–10.3)
Chloride: 103 mmol/L (ref 98–111)
Creatinine, Ser: 0.74 mg/dL (ref 0.44–1.00)
GFR calc Af Amer: >60 mL/min (ref >60)
GFR calc non Af Amer: >60 mL/min (ref >60)
Glucose, Bld: 119 mg/dL — ABNORMAL HIGH (ref 70–99)
Potassium: 4.9 mmol/L (ref 3.5–5.1)
Sodium: 141 mmol/L (ref 135–145)
Total Bilirubin: 0.7 mg/dL (ref 0.3–1.2)
Total Protein: 7 g/dL (ref 6.5–8.1)

## 2019-12-02 LAB — URINALYSIS, ROUTINE W REFLEX MICROSCOPIC
Bilirubin Urine: NEGATIVE
Glucose, UA: NEGATIVE mg/dL
Hgb urine dipstick: NEGATIVE
Ketones, ur: NEGATIVE mg/dL
Leukocytes,Ua: NEGATIVE
Nitrite: NEGATIVE
Protein, ur: NEGATIVE mg/dL
Specific Gravity, Urine: <1.005 — ABNORMAL LOW (ref 1.005–1.030)
pH: 5.5 (ref 5.0–8.0)

## 2019-12-02 MED ORDER — IOHEXOL 300 MG/ML  SOLN
100.0000 mL | Freq: Once | INTRAMUSCULAR | Status: AC | PRN
  Administered 2019-12-02: 100 mL via INTRAVENOUS

## 2019-12-02 MED ORDER — HYDROMORPHONE HCL 1 MG/ML IJ SOLN
0.5000 mg | Freq: Once | INTRAMUSCULAR | Status: AC
  Administered 2019-12-02: 0.5 mg via INTRAVENOUS
  Filled 2019-12-02: qty 1

## 2019-12-02 MED ORDER — METHOCARBAMOL 500 MG PO TABS: 500.0000 mg | ORAL_TABLET | Freq: Two times a day (BID) | ORAL | 0 refills | Status: AC

## 2019-12-02 MED ORDER — FENTANYL CITRATE (PF) 100 MCG/2ML IJ SOLN
50.0000 ug | Freq: Once | INTRAMUSCULAR | Status: AC
  Administered 2019-12-02: 50 ug via INTRAVENOUS
  Filled 2019-12-02: qty 2

## 2019-12-02 NOTE — ED Notes (Signed)
Walked to bathroom earlier with someone else

## 2019-12-02 NOTE — ED Triage Notes (Addendum)
L leg pain x 2 days. Has been doing PT for a herniated disk so thinks she may have overdone it.

## 2019-12-02 NOTE — ED Provider Notes (Signed)
Care assumed from [facility], PA-C at shift change pending CT abdomen. See her note for full HPI.   In short, patient is a 74 year old female who presents to the ED due to left leg pain.  Patient notes pain is from the anterior aspect of her thigh to her groin region.  Pain is worse when ambulating.  She has a history of a herniated disc at L3-L4 and is currently undergoing physical therapy.  Patient has been taking Tylenol, hydrocodone, and muscle relaxer without any improvement.  No recent falls.  Denies saddle paresthesias and bowel/bladder incontinence.  Plan from previous provider: If CT is normal and patient is able to ambulate she may follow-up with PCP for further evaluation. Physical Exam  BP 130/80 (BP Location: Right Arm)   Pulse 79   Temp 98.5 F (36.9 C) (Oral)   Resp 18   Ht 5\' 3"  (1.6 m)   Wt 112.5 kg   SpO2 99%   BMI 43.93 kg/m   Physical Exam Vitals and nursing note reviewed.  Constitutional:      General: She is not in acute distress.    Appearance: She is not ill-appearing.  HENT:     Head: Normocephalic.  Eyes:     Pupils: Pupils are equal, round, and reactive to light.  Cardiovascular:     Rate and Rhythm: Normal rate and regular rhythm.     Pulses: Normal pulses.     Heart sounds: Normal heart sounds. No murmur heard.  No friction rub. No gallop.   Pulmonary:     Effort: Pulmonary effort is normal.     Breath sounds: Normal breath sounds.  Abdominal:     General: Abdomen is flat. Bowel sounds are normal. There is no distension.     Palpations: Abdomen is soft.     Tenderness: There is abdominal tenderness. There is no guarding or rebound.     Comments: Tenderness palpation in the left lower quadrant.  No rebound or guarding.  Musculoskeletal:     Cervical back: Neck supple.     Comments: Tenderness to palpation over anterior left thigh without obvious deformity or rash.  No tenderness to midline thoracic or lumbar regions. No step-offs or  deformity. Increased pain with flexion of hip at groin. Patient able to ambulate here in the ED. LLE neurovascularly intact.   Neurological:     General: No focal deficit present.     Mental Status: She is alert.     ED Course/Procedures     Procedures  MDM  Care assumed from [facility], PA-C at shift change pending CT abdomen. See her note for full MDM.  74 year old female presents the ED due to worsening leg pain.  On exam patient is nontoxic.  Left lower extremity neurovascularly intact.  Pain is reproducible with palpation to anterior thigh as well as groin and left lower quadrant abdomen. No erythema or edema of left leg to suggest infection or DVT.  Previous provider was concerned about non-MSK etiology such as diverticulitis or kidney stone.  Routine labs and CT abdomen obtained.  If negative previous provider recommended treating it symptomatically for MSK pain.  CBC reassuring with no leukocytosis and normal hemoglobin.  CMP reassuring with normal renal function no major electrolyte derangements.  UA negative for hematuria or signs of infection. CT abdomen personally reviewed which demonstrates:   IMPRESSION:  1. No acute abnormality.  2. Colonic diverticulosis.   Presentation nonconcerning for DVT.  Suspect symptoms related to MSK etiology.  Patient able to ambulate here in the ED without difficulty.  We will continue treatment per patient's PCP.  Advised patient to follow-up with PCP within the next week for further evaluation. Strict ED precautions discussed with patient. Patient states understanding and agrees to plan. Patient discharged home in no acute distress and stable vitals.  Previous provider discussed case with Dr. Rex Kras who agreed with assessment and plan.    Karie Kirks 12/02/19 2035    Rex Kras Wenda Overland, MD 12/04/19 [phone]

## 2019-12-02 NOTE — ED Notes (Signed)
Pt taken to bathroom in wheelchair due to distance, but pt able to get in and out of wheelchair and walk around bathroom to use toilet and sink.

## 2019-12-02 NOTE — Discharge Instructions (Addendum)
As discussed, all of your labs are reassuring today.  Your CT scan was negative for any acute abnormalities.  I suspect your pain is related to musculoskeletal etiology.  Continue take your pain medication as prescribed by your PCP.  Please follow-up with PCP within the next week if symptoms do not improve.  Return to the ER for new or worsening symptoms.  You may also purchase over-the-counter Lidoderm patches and Voltaren gel for added pain relief.

## 2019-12-02 NOTE — ED Notes (Signed)
[phone] husband

## 2019-12-02 NOTE — ED Provider Notes (Signed)
[place] EMERGENCY DEPARTMENT Provider Note   CSN: [id number] Arrival date & time: 12/02/19  1644     History Chief Complaint  Patient presents with  . Leg Pain    Skai Lickteig is a 74 y.o. female presenting for evaluation of left leg pain.  Patient states over the past several days, she has had worsening left leg pain.  Initially was in her anterior thigh, but today is up in her groin.  Pain is becoming more constant and severe.  She is able to walk, but reports worsening pain with weightbearing.  Additionally worsening pain with flexion of the hip.  She is a history of herniated disc at L3-L4, currently getting PT for this.  She has a history of steroid shots into her back, and has a follow-up appointment with her neurosurgeon next week due to worsening pain.  She called her neurosurgeon, who called in Tylenol, hydrocodone, and a muscle relaxer which he has been taking without improvement.  She reports her left leg feels tingly/weak due to pain.  She reports urinary frequency and nausea today.  She denies fevers, chills, chest pain, shortness of breath, vomiting, right side abdominal pain, or abnormal bowel movements.  She denies a history of kidney stones or diverticulitis.  She denies recent travel, surgeries, immobilization, history of cancer, history of previous DVT/PE, or hormone use.  She denies history of IVDU.  She denies loss of bowel bladder control.   HPI     Past Medical History:  Diagnosis Date  . Hypertension     There are no problems to display for this patient.   Past Surgical History:  Procedure Laterality Date  . JOINT REPLACEMENT     R knee     OB History   No obstetric history on file.     No family history on file.  Social History   Tobacco Use  . Smoking status: Never Smoker  . Smokeless tobacco: Never Used  Substance Use Topics  . Alcohol use: Yes  . Drug use: Never    Home Medications Prior to Admission medications     Medication Sig Start Date End Date Taking? Authorizing Provider  ALPRAZolam Duanne Moron) 0.5 MG tablet Take 0.5 mg by mouth at bedtime as needed. For sleep    [provider]  amLODipine (NORVASC) 5 MG tablet Take 5 mg by mouth daily.      [provider]  atorvastatin (LIPITOR) 10 MG tablet Take 10 mg by mouth daily.    [provider]  benzonatate (TESSALON) 100 MG capsule Take 1 capsule (100 mg total) by mouth every 8 (eight) hours. 05/05/18   Petrucelli, Samantha R, PA-C  celecoxib (CELEBREX) 200 MG capsule Take 200 mg by mouth 2 (two) times daily.    [provider]  cephALEXin (KEFLEX) 500 MG capsule Take 1 capsule (500 mg total) by mouth 2 (two) times daily. 09/12/17   Malvin Johns, MD  cyclobenzaprine (FLEXERIL) 5 MG tablet Take 5 mg by mouth 3 (three) times daily as needed for muscle spasms.    [provider]  desvenlafaxine (PRISTIQ) 50 MG 24 hr tablet Take 50 mg by mouth daily.      [provider]  diclofenac sodium (VOLTAREN) 1 % GEL Apply topically 4 (four) times daily.    [provider]  DULoxetine (CYMBALTA) 60 MG capsule Take 60 mg by mouth daily.    [provider]  fluticasone (FLONASE) 50 MCG/ACT nasal spray Place 1 spray into  both nostrils daily. 05/05/18   Petrucelli, Samantha R, PA-C  gabapentin (NEURONTIN) 100 MG capsule Take 100 mg by mouth daily.     [provider]  omeprazole (PRILOSEC) 20 MG capsule Take 20 mg by mouth daily.      [provider]  ondansetron (ZOFRAN-ODT) 4 MG disintegrating tablet Take 4 mg by mouth every 8 (eight) hours as needed. For nausea    [provider]  predniSONE (STERAPRED UNI-PAK) 10 MG tablet Take 10 mg by mouth daily.      [provider]  rOPINIRole (REQUIP) 0.5 MG tablet Take 0.5 mg by mouth daily as needed. For restless legs    [provider]  sertraline (ZOLOFT) 100 MG tablet Take 100 mg by mouth daily.    [provider]  sulfamethoxazole-trimethoprim (BACTRIM DS,SEPTRA DS) 800-160 MG tablet Take 1 tablet by mouth 2 (two) times daily.    [provider]  traMADol (ULTRAM) 50 MG tablet Take 50 mg by mouth every 6 (six) hours as needed. For pain    [provider]  zolpidem (AMBIEN) 5 MG tablet Take 5 mg by mouth at bedtime as needed for sleep.    [provider]    Allergies    Macrobid [nitrofurantoin]  Review of Systems   Review of Systems  Gastrointestinal: Positive for nausea.  Genitourinary: Positive for frequency.  Musculoskeletal: Positive for arthralgias and myalgias.  All other systems reviewed and are negative.   Physical Exam Updated Vital Signs BP 130/80 (BP Location: Right Arm)   Pulse 79   Temp 98.5 F (36.9 C) (Oral)   Resp 18   Ht 5\' 3"  (1.6 m)   Wt 112.5 kg   SpO2 99%   BMI 43.93 kg/m   Physical Exam Vitals and nursing note reviewed.  Constitutional:      General: She is not in acute distress.    Appearance: She is well-developed.     Comments: Resting in the bed in no acute distress  HENT:     Head: Normocephalic and atraumatic.  Eyes:     Conjunctiva/sclera: Conjunctivae normal.     Pupils: Pupils are equal, round, and reactive to light.  Cardiovascular:     Rate and Rhythm: Normal rate and regular rhythm.     Pulses: Normal pulses.  Pulmonary:     Effort: Pulmonary effort is normal. No respiratory distress.     Breath sounds: Normal breath sounds. No wheezing.  Abdominal:     General: There is no distension.     Palpations: Abdomen is soft. There is no mass.     Tenderness: There is abdominal tenderness. There is no guarding or rebound.     Comments: Tenderness palpation of left side abdomen, mostly in the left lower abdomen and the groin/inguinal region.  No rigidity, guarding, distention.  Negative rebound.  No CVA tenderness.  Musculoskeletal:        General: Tenderness present. Normal range of motion.     Cervical  back: Normal range of motion and neck supple.     Comments: Tenderness palpation of anterior right thigh without obvious deformity or rash.  No tenderness palpation of back or midline spine.  No step-offs or deformities.  Increased pain with flexion of the hip at the groin.  No tenderness palpation of lower leg.  No leg asymmetry.  Pedal pulses 2+ bilaterally.  Good distal sensation and cap refill.  Skin:    General: Skin is warm and dry.  Capillary Refill: Capillary refill takes less than 2 seconds.  Neurological:     Mental Status: She is alert and oriented to person, place, and time.     ED Results / Procedures / Treatments   Labs (all labs ordered are listed, but only abnormal results are displayed) Labs Reviewed - No data to display  EKG None  Radiology No results found.  Procedures Procedures (including critical care time)  Medications Ordered in ED Medications - No data to display  ED Course  I have reviewed the triage vital signs and the nursing notes.  Pertinent labs & imaging results that were available during my care of the patient were reviewed by me and considered in my medical decision making (see chart for details).    MDM Rules/Calculators/A&P                          Patient presenting for evaluation of worsened left leg pain.  On exam, patient appears nontoxic.  She is neurovascularly intact.  Pain reproducible with palpation of the anterior thigh as well as the groin and abdomen.  As such, consider non-MSK cause such as diverticulitis or kidney stone.  Consider UTI.  Will obtain labs, urine, and CT abdomen pelvis.  If negative, likely MSK pain and can be treated symptomatically.  Discussed with attending, Dr. Clarene Duke agrees to plan.  Labs interpreted by me, overall reassuring.  No leukocytosis.  Electrolytes stable.  Urine without obvious infection.  CT pending.  Patient signed out to see a C Aberman, PA-C for follow-up on CT and reassessment of pt. If  negative, plan for ambulation and d/c with f/u with neurosurg.   Final Clinical Impression(s) / ED Diagnoses Final diagnoses:  None    Rx / DC Orders ED Discharge Orders    None       Alveria Apley, PA-C 12/02/19 1936    Little, Ambrose Finland, MD 12/04/19 1558

## 2020-04-14 ENCOUNTER — Telehealth: Payer: Self-pay | Admitting: Infectious Diseases

## 2020-04-14 ENCOUNTER — Other Ambulatory Visit: Payer: Self-pay | Admitting: Infectious Diseases

## 2020-04-14 DIAGNOSIS — U071 COVID-19: Secondary | ICD-10-CM

## 2020-04-14 DIAGNOSIS — E669 Obesity, unspecified: Secondary | ICD-10-CM

## 2020-04-14 NOTE — Telephone Encounter (Signed)
Called to Discuss with patient about Covid symptoms and the use of the monoclonal antibody infusion for those with mild to moderate Covid symptoms and at a high risk of hospitalization.     Pt appears to qualify for this infusion due to co-morbid conditions and/or a member of an at-risk group in accordance with the FDA Emergency Use Authorization.   In speaking to Nicholl's wife, she has classic symptoms of COVID infection. Her husband is known positive. Sx started within 48 hours ago. Qualifies for infusion with BMI > 35 and Age > 65.  Will have her come with her husband for high risk presumptive positive following known exposure.    Rexene Alberts, MSN, NP-C [facility]  [email or web address] Pager: [phone] Office: [phone] [facility] Main Line: [phone]

## 2020-04-14 NOTE — Progress Notes (Signed)
I connected by phone with Meriam Sprague Linsley's husband Ed on 04/14/2020 at 9:17 AM to discuss the potential use of a new treatment for mild to moderate COVID-19 viral infection in non-hospitalized patients.  This patient is a 74 y.o. female that meets the FDA criteria for Emergency Use Authorization of COVID monoclonal antibody casirivimab/imdevimab, bamlanivimab/eteseviamb, or sotrovimab.  Has a (+) direct SARS-CoV-2 viral test result  Has mild or moderate COVID-19   Is NOT hospitalized due to COVID-19  Is within 10 days of symptom onset  Has at least one of the high risk factor(s) for progression to severe COVID-19 and/or hospitalization as defined in EUA.  Specific high risk criteria : Older age (>/= 74 yo), BMI > 25 and Cardiovascular disease or hypertension   I have spoken and communicated the following to the patient or parent/caregiver regarding COVID monoclonal antibody treatment:  1. FDA has authorized the emergency use for the treatment of mild to moderate COVID-19 in adults and pediatric patients with positive results of direct SARS-CoV-2 viral testing who are 61 years of age and older weighing at least 40 kg, and who are at high risk for progressing to severe COVID-19 and/or hospitalization.  2. The significant known and potential risks and benefits of COVID monoclonal antibody, and the extent to which such potential risks and benefits are unknown.  3. Information on available alternative treatments and the risks and benefits of those alternatives, including clinical trials.  4. Patients treated with COVID monoclonal antibody should continue to self-isolate and use infection control measures (e.g., wear mask, isolate, social distance, avoid sharing personal items, clean and disinfect "high touch" surfaces, and frequent handwashing) according to CDC guidelines.   5. The patient or parent/caregiver has the option to accept or refuse COVID monoclonal antibody treatment.  After  reviewing this information with the patient, the patient has agreed to receive one of the available covid 19 monoclonal antibodies and will be provided an appropriate fact sheet prior to infusion. Rexene Alberts, NP 04/14/2020 9:17 AM

## 2020-04-15 ENCOUNTER — Ambulatory Visit (HOSPITAL_COMMUNITY)
Admission: RE | Admit: 2020-04-15 | Discharge: 2020-04-15 | Disposition: A | Discharge status: Home / Self Care | Payer: Medicare HMO | Source: Ambulatory Visit | Attending: Pulmonary Disease | Admitting: Pulmonary Disease

## 2020-04-15 DIAGNOSIS — Z23 Encounter for immunization: Secondary | ICD-10-CM | POA: Insufficient documentation

## 2020-04-15 DIAGNOSIS — U071 COVID-19: Secondary | ICD-10-CM | POA: Insufficient documentation

## 2020-04-15 DIAGNOSIS — E669 Obesity, unspecified: Secondary | ICD-10-CM | POA: Insufficient documentation

## 2020-04-15 MED ORDER — METHYLPREDNISOLONE SODIUM SUCC 125 MG IJ SOLR: 125.0000 mg | Freq: Once | INTRAMUSCULAR | Status: DC | PRN

## 2020-04-15 MED ORDER — SODIUM CHLORIDE 0.9 % IV SOLN: INTRAVENOUS | Status: DC | PRN

## 2020-04-15 MED ORDER — FAMOTIDINE IN NACL 20-0.9 MG/50ML-% IV SOLN: 20.0000 mg | Freq: Once | INTRAVENOUS | Status: DC | PRN

## 2020-04-15 MED ORDER — EPINEPHRINE 0.3 MG/0.3ML IJ SOAJ: 0.3000 mg | Freq: Once | INTRAMUSCULAR | Status: DC | PRN

## 2020-04-15 MED ORDER — ALBUTEROL SULFATE HFA 108 (90 BASE) MCG/ACT IN AERS: 2.0000 | INHALATION_SPRAY | Freq: Once | RESPIRATORY_TRACT | Status: DC | PRN

## 2020-04-15 MED ORDER — DIPHENHYDRAMINE HCL 50 MG/ML IJ SOLN: 50.0000 mg | Freq: Once | INTRAMUSCULAR | Status: DC | PRN

## 2020-04-15 MED ORDER — SOTROVIMAB 500 MG/8ML IV SOLN
500.0000 mg | Freq: Once | INTRAVENOUS | Status: AC
  Administered 2020-04-15: 500 mg via INTRAVENOUS

## 2020-04-15 NOTE — Progress Notes (Signed)
  Diagnosis: COVID-19  Physician: Dr. Wright  Procedure: [facility] Med: sotrovimab infusion - Provided patient with sotrovimab fact sheet for patients, parents and caregivers prior to infusion.  Complications: No immediate complications noted.  Discharge: Discharged home   [name] 04/15/2020   

## 2020-04-15 NOTE — Discharge Instructions (Signed)

## 2021-02-12 IMAGING — CT CT ABD-PELV W/ CM
2 of 5 series · 17 of 46 positions shown, 19 images · IV contrast (Omnipaque)
Comparison: 08/10/2012 and renal ultrasound dated 11/04/2017

CLINICAL DATA: Intermittent sharp left lower quadrant abdominal
pain for 1 month.

EXAM:
CT ABDOMEN AND PELVIS WITH CONTRAST
TECHNIQUE: Multidetector CT imaging of the abdomen and pelvis was performed
using the standard protocol following bolus administration of
intravenous contrast.
CONTRAST:  100mL OMNIPAQUE IOHEXOL 300 MG/ML  SOLN

[Series 2: axial st · axial · 0.97mm/px · z∈[-462,-36]mm · 14 of 96 slices shown, 16 images]
[im 6/96  soft-tissue]
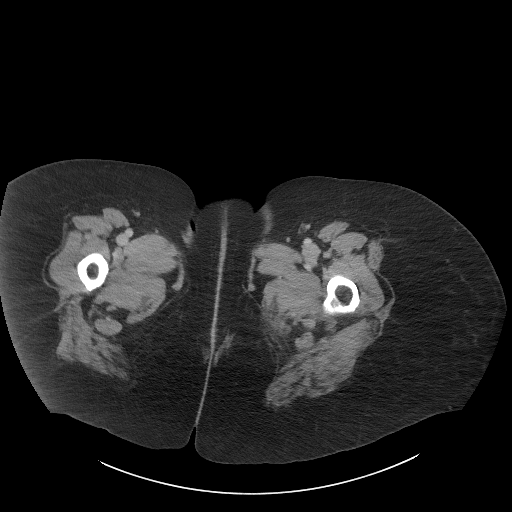
[im 6/96  bone]
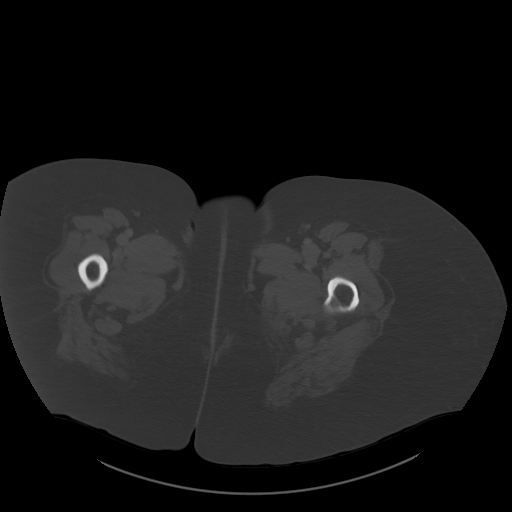
[im 11/96  soft-tissue]
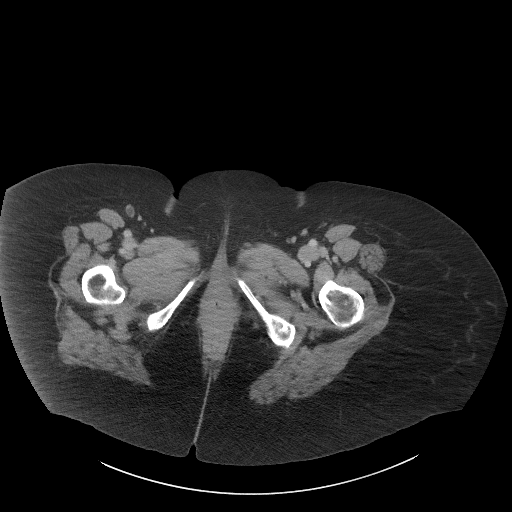
[im 21/96  soft-tissue]
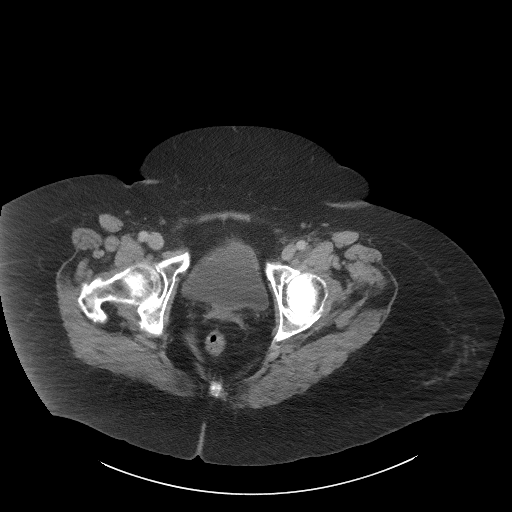
[im 26/96  soft-tissue]
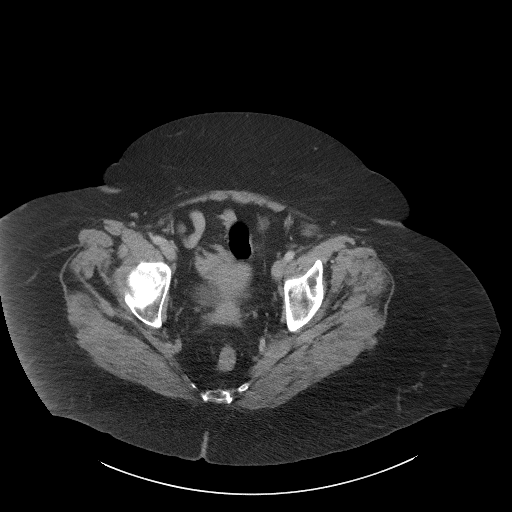
[im 31/96  soft-tissue]
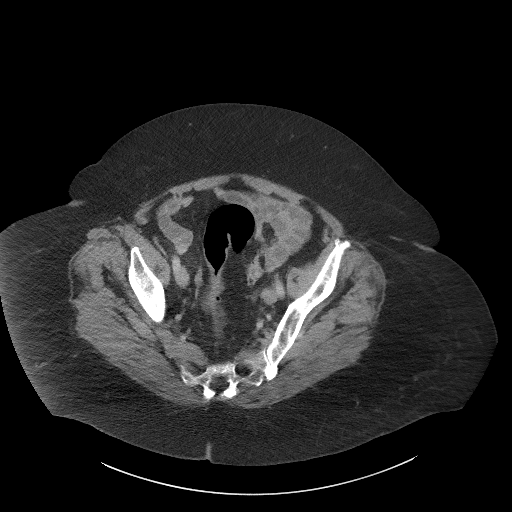
[im 41/96  soft-tissue]
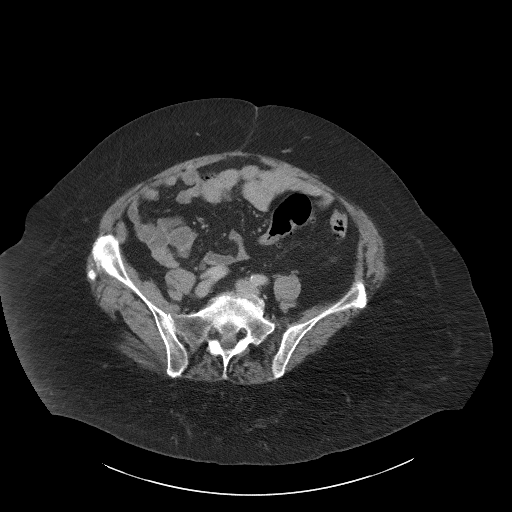
[im 46/96  soft-tissue]
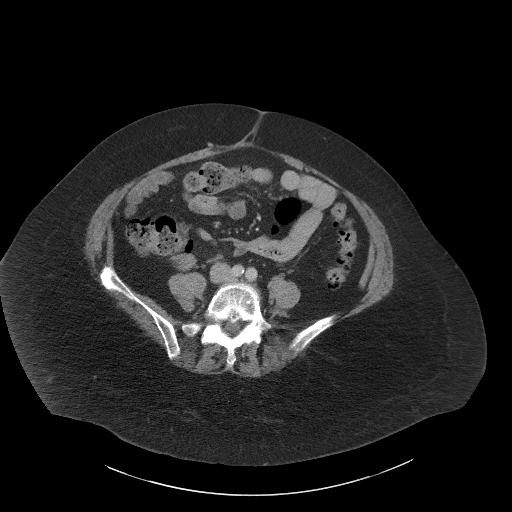
[im 51/96  soft-tissue]
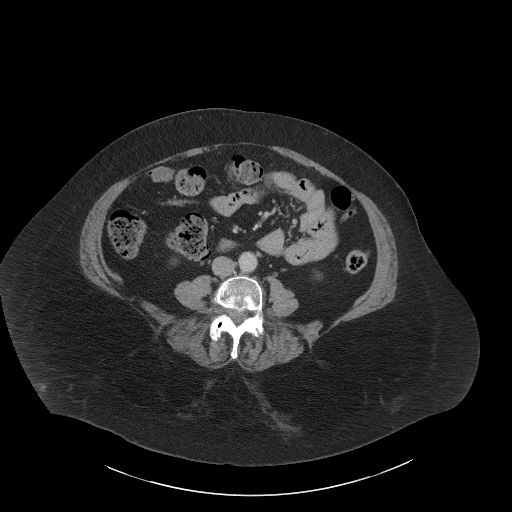
[im 56/96  soft-tissue]
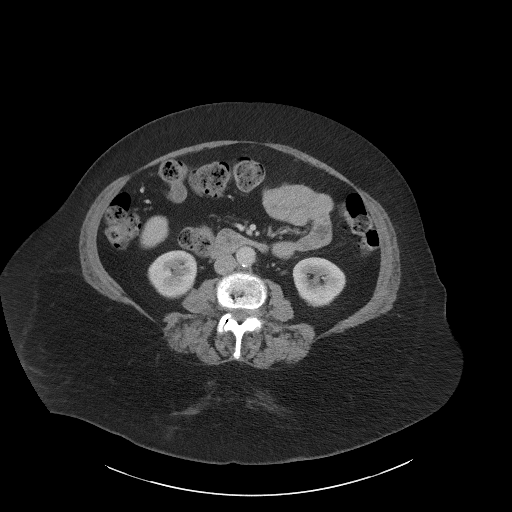
[im 56/96  bone]
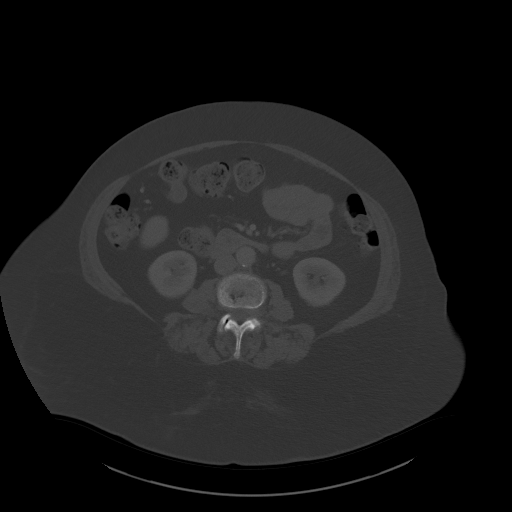
[im 66/96  soft-tissue]
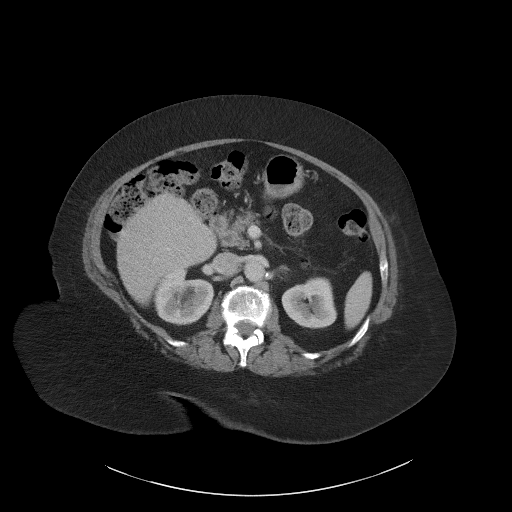
[im 71/96  soft-tissue]
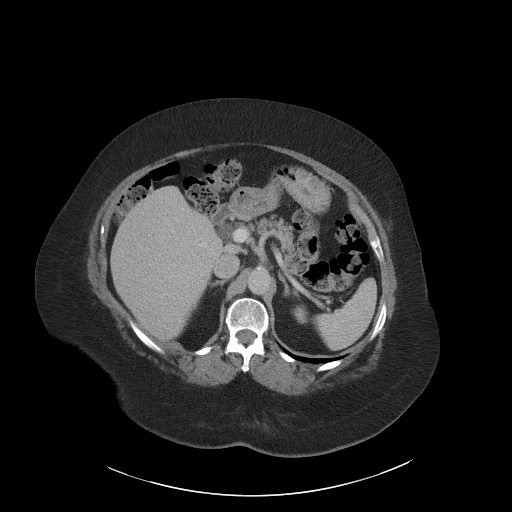
[im 76/96  soft-tissue]
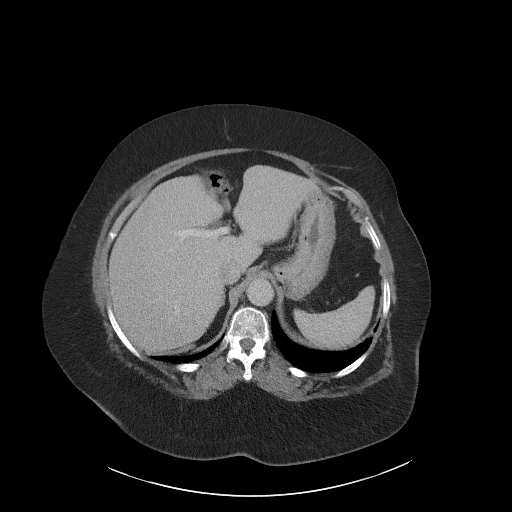
[im 86/96  soft-tissue]
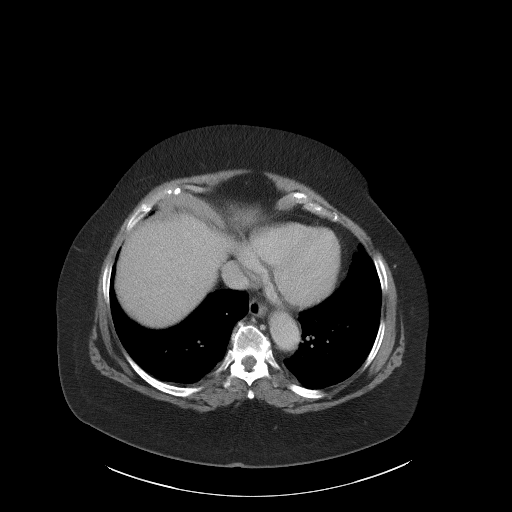
[im 91/96  soft-tissue]
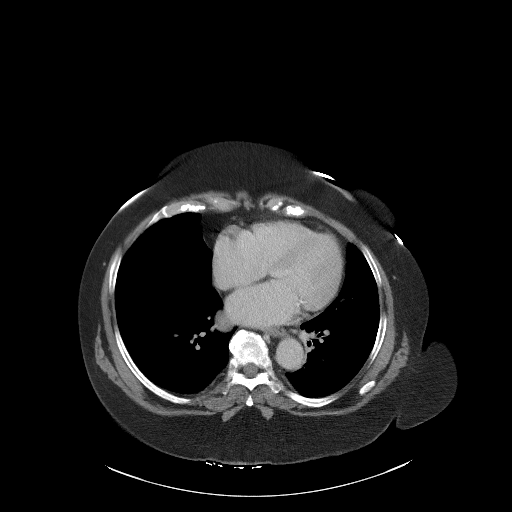

[Series 5: coronal st · coronal · 0.97mm/px · 3 of 119 slices shown]
[im 40/119  soft-tissue]
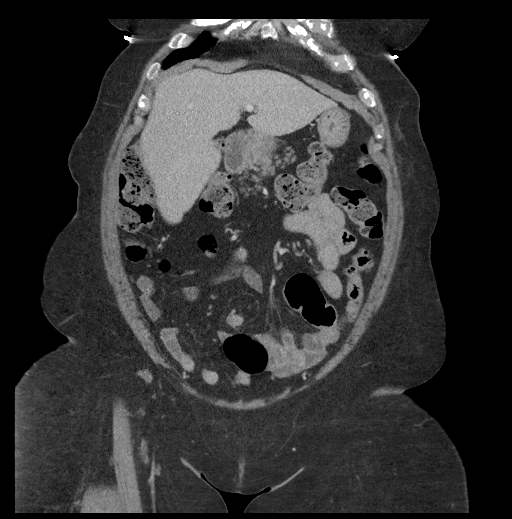
[im 53/119  soft-tissue]
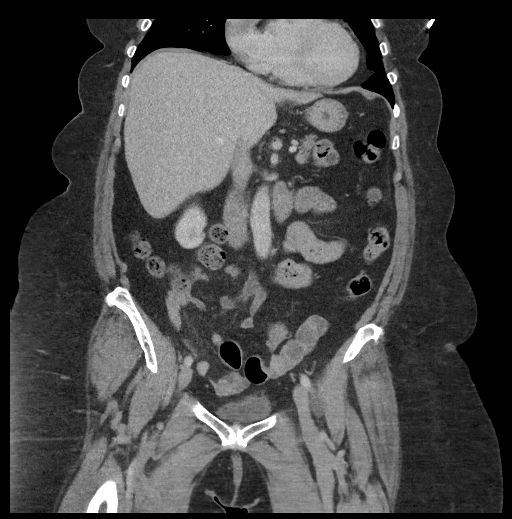
[im 66/119  soft-tissue]
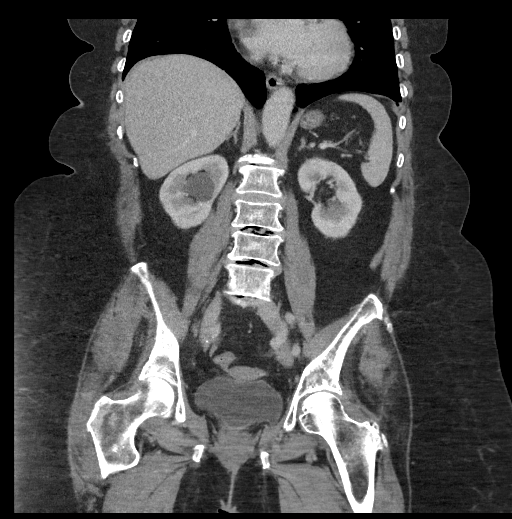

[17 of 46 positions shown; findings below may reference images not displayed]

FINDINGS: Lower chest: Unremarkable.

Hepatobiliary: Normal appearing liver. The gallbladder is no longer
visualized.

Pancreas: Moderate diffuse atrophy.

Spleen: Normal in size without focal abnormality.

Adrenals/Urinary Tract: Normal appearing adrenal glands. Simple
appearing right renal cyst. Normal appearing left kidney, ureters
and urinary bladder.

Stomach/Bowel: Multiple sigmoid and descending colon diverticula.
Normal appearing appendix, stomach and small bowel.

Vascular/Lymphatic: No significant vascular findings are present. No
enlarged abdominal or pelvic lymph nodes.

Reproductive: Uterus and bilateral adnexa are unremarkable.

Other: No abdominal wall hernia or abnormality. No abdominopelvic
ascites.

Musculoskeletal: Lumbar and lower thoracic spine degenerative
changes. These include facet degenerative changes with associated
grade 1 anterolisthesis at the L4-5 level.
IMPRESSION: 1. No acute abnormality.
2. Colonic diverticulosis.

## 2022-05-05 ENCOUNTER — Emergency Department (HOSPITAL_BASED_OUTPATIENT_CLINIC_OR_DEPARTMENT_OTHER): Payer: Medicare HMO

## 2022-05-05 ENCOUNTER — Encounter (HOSPITAL_BASED_OUTPATIENT_CLINIC_OR_DEPARTMENT_OTHER): Payer: Self-pay

## 2022-05-05 ENCOUNTER — Other Ambulatory Visit: Payer: Self-pay

## 2022-05-05 ENCOUNTER — Emergency Department (HOSPITAL_BASED_OUTPATIENT_CLINIC_OR_DEPARTMENT_OTHER)
Admission: EM | Admit: 2022-05-05 | Discharge: 2022-05-05 | Disposition: A | Discharge status: Home / Self Care | Payer: Medicare HMO | Attending: Emergency Medicine | Admitting: Emergency Medicine

## 2022-05-05 DIAGNOSIS — H1033 Unspecified acute conjunctivitis, bilateral: Secondary | ICD-10-CM | POA: Insufficient documentation

## 2022-05-05 DIAGNOSIS — R058 Other specified cough: Secondary | ICD-10-CM | POA: Diagnosis not present

## 2022-05-05 DIAGNOSIS — B309 Viral conjunctivitis, unspecified: Secondary | ICD-10-CM

## 2022-05-05 DIAGNOSIS — Z1152 Encounter for screening for COVID-19: Secondary | ICD-10-CM | POA: Insufficient documentation

## 2022-05-05 DIAGNOSIS — J40 Bronchitis, not specified as acute or chronic: Secondary | ICD-10-CM | POA: Diagnosis not present

## 2022-05-05 DIAGNOSIS — R059 Cough, unspecified: Secondary | ICD-10-CM | POA: Diagnosis present

## 2022-05-05 DIAGNOSIS — B9789 Other viral agents as the cause of diseases classified elsewhere: Secondary | ICD-10-CM | POA: Insufficient documentation

## 2022-05-05 LAB — RESP PANEL BY RT-PCR (FLU A&B, COVID) ARPGX2
Influenza A by PCR: NEGATIVE
Influenza B by PCR: NEGATIVE
SARS Coronavirus 2 by RT PCR: NEGATIVE

## 2022-05-05 MED ORDER — HYDROCOD POLI-CHLORPHE POLI ER 10-8 MG/5ML PO SUER: 5.0000 mL | Freq: Every evening | ORAL | 0 refills | Status: AC | PRN

## 2022-05-05 MED ORDER — AZITHROMYCIN 250 MG PO TABS: 250.0000 mg | ORAL_TABLET | Freq: Every day | ORAL | 0 refills | Status: AC

## 2022-05-05 NOTE — ED Notes (Signed)
Cleaned patients eye lid, flushed some salience in eye to help with burning.

## 2022-05-05 NOTE — ED Triage Notes (Signed)
C/o sore throat, fever, left ear, cough, shortness of breath when coughing since yesterday. Husband went to [facility] yesterday

## 2022-05-05 NOTE — ED Provider Notes (Signed)
[facility] EMERGENCY DEPARTMENT Provider Note   CSN: [id number] Arrival date & time: 05/05/22  1843     History {Add pertinent medical, surgical, social history, OB history to HPI:1} Chief Complaint  Patient presents with   URI    Denise Little is a 76 y.o. female.  She is here with upper respiratory infection symptoms.  She is complaining of some eye discharge bilaterally along with cough and runny nose.  She said a little bit of a sore throat.  She is also had some loose stools.  Husband is also sick.  They just got back from [place] visiting family including young children.  She is not sure if she had a fever but she has felt cold.  The history is provided by the patient and the spouse.  URI Presenting symptoms: congestion, cough, fever and rhinorrhea   Presenting symptoms: no ear pain   Congestion:    Location:  Nasal and chest   Interferes with sleep: yes     Interferes with eating/drinking: no   Severity:  Moderate Duration:  2 days Timing:  Constant Progression:  Unchanged Chronicity:  New Risk factors: recent travel and sick contacts        Home Medications Prior to Admission medications   Medication Sig Start Date End Date Taking? Authorizing Provider  ALPRAZolam Prudy Feeler) 0.5 MG tablet Take 0.5 mg by mouth at bedtime as needed. For sleep    [provider]  amLODipine (NORVASC) 5 MG tablet Take 5 mg by mouth daily.      [provider]  atorvastatin (LIPITOR) 10 MG tablet Take 10 mg by mouth daily.    [provider]  benzonatate (TESSALON) 100 MG capsule Take 1 capsule (100 mg total) by mouth every 8 (eight) hours. 05/05/18   Petrucelli, Samantha R, PA-C  celecoxib (CELEBREX) 200 MG capsule Take 200 mg by mouth 2 (two) times daily.    [provider]  cephALEXin (KEFLEX) 500 MG capsule Take 1 capsule (500 mg total) by mouth 2 (two) times daily. 09/12/17   Rolan Bucco, MD  cyclobenzaprine (FLEXERIL) 5 MG tablet  Take 5 mg by mouth 3 (three) times daily as needed for muscle spasms.    [provider]  desvenlafaxine (PRISTIQ) 50 MG 24 hr tablet Take 50 mg by mouth daily.      [provider]  diclofenac sodium (VOLTAREN) 1 % GEL Apply topically 4 (four) times daily.    [provider]  DULoxetine (CYMBALTA) 60 MG capsule Take 60 mg by mouth daily.    [provider]  fluticasone (FLONASE) 50 MCG/ACT nasal spray Place 1 spray into both nostrils daily. 05/05/18   Petrucelli, Samantha R, PA-C  gabapentin (NEURONTIN) 100 MG capsule Take 100 mg by mouth daily.     [provider]  methocarbamol (ROBAXIN) 500 MG tablet Take 1 tablet (500 mg total) by mouth 2 (two) times daily. 12/02/19   Mannie Stabile, PA-C  omeprazole (PRILOSEC) 20 MG capsule Take 20 mg by mouth daily.      [provider]  ondansetron (ZOFRAN-ODT) 4 MG disintegrating tablet Take 4 mg by mouth every 8 (eight) hours as needed. For nausea    [provider]  predniSONE (STERAPRED UNI-PAK) 10 MG tablet Take 10 mg by mouth daily.      [provider]  rOPINIRole (REQUIP) 0.5 MG tablet Take 0.5 mg by mouth daily as needed. For restless legs    [provider]  sertraline (ZOLOFT) 100 MG tablet Take 100 mg by mouth daily.    [provider]  sulfamethoxazole-trimethoprim (BACTRIM DS,SEPTRA DS) 800-160 MG tablet Take 1 tablet by mouth 2 (two) times daily.    [provider]  traMADol (ULTRAM) 50 MG tablet Take 50 mg by mouth every 6 (six) hours as needed. For pain    [provider]  zolpidem (AMBIEN) 5 MG tablet Take 5 mg by mouth at bedtime as needed for sleep.    [provider]      Allergies    Macrobid [nitrofurantoin]    Review of Systems   Review of Systems  Constitutional:  Positive for fever.  HENT:  Positive for congestion and rhinorrhea. Negative for ear pain.   Eyes:  Positive for discharge.  Respiratory:   Positive for cough.   Cardiovascular:  Negative for chest pain.  Gastrointestinal:  Negative for abdominal pain.  Skin:  Negative for rash.    Physical Exam Updated Vital Signs BP (!) 158/96 (BP Location: Right Arm)   Pulse 84   Temp 98.1 F (36.7 C)   Resp 17   Ht 5\' 3"  (1.6 m)   Wt 113.4 kg   SpO2 100%   BMI 44.29 kg/m  Physical Exam Vitals and nursing note reviewed.  Constitutional:      General: She is not in acute distress.    Appearance: Normal appearance. She is well-developed.  HENT:     Head: Normocephalic and atraumatic.     Right Ear: Tympanic membrane normal.     Left Ear: Tympanic membrane normal.     Nose: Rhinorrhea present.     Mouth/Throat:     Mouth: Mucous membranes are moist.     Pharynx: Oropharynx is clear. No oropharyngeal exudate or posterior oropharyngeal erythema.  Eyes:     General:        Right eye: Discharge present.        Left eye: Discharge present.    Extraocular Movements: Extraocular movements intact.     Pupils: Pupils are equal, round, and reactive to light.  Cardiovascular:     Rate and Rhythm: Normal rate and regular rhythm.     Heart sounds: No murmur heard. Pulmonary:     Effort: Pulmonary effort is normal. No respiratory distress.     Breath sounds: Normal breath sounds.  Abdominal:     Palpations: Abdomen is soft.     Tenderness: There is no abdominal tenderness. There is no guarding or rebound.  Musculoskeletal:        General: No deformity.     Cervical back: Neck supple. No rigidity.  Skin:    General: Skin is warm and dry.     Capillary Refill: Capillary refill takes less than 2 seconds.  Neurological:     General: No focal deficit present.     Mental Status: She is alert.     ED Results / Procedures / Treatments   Labs (all labs ordered are listed, but only abnormal results are displayed) Labs Reviewed  RESP PANEL BY RT-PCR (FLU A&B, COVID) [id number]    EKG None  Radiology DG Chest 2 View  Result Date:  05/05/2022 CLINICAL DATA:  Cough and shortness of breath. EXAM: CHEST - 2 VIEW COMPARISON:  Chest two views 08/30/2021 and 05/05/2018 FINDINGS: Cardiac silhouette and mediastinal contours are within normal limits with mild calcification within aortic arch. Mildly decreased lung volumes, similar to 08/30/2021 but with hypoinflation compared to 05/05/2018. Mild bilateral interstitial  thickening is at least in part due to the low lung volumes. No focal airspace opacity to indicate pneumonia. No pleural effusion or pneumothorax. Moderate multilevel degenerative disc changes of the thoracic spine. IMPRESSION: Mild bilateral interstitial thickening, at least in part due to the low lung volumes. This may represent very mild interstitial pulmonary edema. No focal airspace opacity to indicate pneumonia. Electronically Signed   By: Neita Garnet M.D.   On: 05/05/2022 19:40    Procedures Procedures  {Document cardiac monitor, telemetry assessment procedure when appropriate:1}  Medications Ordered in ED Medications - No data to display  ED Course/ Medical Decision Making/ A&P                           Medical Decision Making Amount and/or Complexity of Data Reviewed Radiology: ordered.   This patient complains of ***; this involves an extensive number of treatment Options and is a complaint that carries with it a high risk of complications and morbidity. The differential includes ***  I ordered, reviewed and interpreted labs, which included *** I ordered medication *** and reviewed PMP when indicated. I ordered imaging studies which included *** and I independently    visualized and interpreted imaging which showed *** Additional history obtained from *** Previous records obtained and reviewed *** I consulted *** and discussed lab and imaging findings and discussed disposition.  Cardiac monitoring reviewed, *** Social determinants considered, *** Critical Interventions: ***  After the  interventions stated above, I reevaluated the patient and found *** Admission and further testing considered, ***   {Document critical care time when appropriate:1} {Document review of labs and clinical decision tools ie heart score, Chads2Vasc2 etc:1}  {Document your independent review of radiology images, and any outside records:1} {Document your discussion with family members, caretakers, and with consultants:1} {Document social determinants of health affecting pt's care:1} {Document your decision making why or why not admission, treatments were needed:1} Final Clinical Impression(s) / ED Diagnoses Final diagnoses:  None    Rx / DC Orders ED Discharge Orders     None

## 2022-05-05 NOTE — Discharge Instructions (Signed)
You were seen in the emergency for cough congestion and watery discharge in your's.  This is likely a viral infection.  Your chest x-ray does not show any pneumonia.  Please use warm wet facecloth to clear your eyes.  We are covering you with some antibiotics and cough medication for your chest symptoms.  Contact your primary care doctor for close follow-up.  Return to the emergency department if any worsening or concerning symptoms.

## 2022-05-06 ENCOUNTER — Telehealth: Payer: Self-pay | Admitting: *Deleted

## 2022-05-06 NOTE — Telephone Encounter (Signed)
Pt called regarding Rx for pink eye.  RNCM directed pt to discharge instructions that mentions watery discharge from eye is likely viral and antibiotics prescribed should cover infection and to place warm, wet facecloth to clear eyes.  Pt appreciative of information.  Avish Torry J. Lucretia Roers, RN, BSN, NCM  Transitions of Care  Nurse Case Manager  [facility] Emergency Departments  Operative Services  [phone]

## 2022-09-29 ENCOUNTER — Encounter (HOSPITAL_BASED_OUTPATIENT_CLINIC_OR_DEPARTMENT_OTHER): Payer: Self-pay | Admitting: Urology

## 2022-09-29 ENCOUNTER — Emergency Department (HOSPITAL_BASED_OUTPATIENT_CLINIC_OR_DEPARTMENT_OTHER)
Admission: EM | Admit: 2022-09-29 | Discharge: 2022-09-29 | Disposition: A | Discharge status: Home / Self Care | Payer: Medicare HMO | Attending: Emergency Medicine | Admitting: Emergency Medicine

## 2022-09-29 ENCOUNTER — Other Ambulatory Visit: Payer: Self-pay

## 2022-09-29 ENCOUNTER — Emergency Department (HOSPITAL_BASED_OUTPATIENT_CLINIC_OR_DEPARTMENT_OTHER): Payer: Medicare HMO

## 2022-09-29 DIAGNOSIS — R1032 Left lower quadrant pain: Secondary | ICD-10-CM

## 2022-09-29 DIAGNOSIS — R1030 Lower abdominal pain, unspecified: Secondary | ICD-10-CM | POA: Diagnosis present

## 2022-09-29 DIAGNOSIS — I10 Essential (primary) hypertension: Secondary | ICD-10-CM | POA: Insufficient documentation

## 2022-09-29 DIAGNOSIS — Z79899 Other long term (current) drug therapy: Secondary | ICD-10-CM | POA: Insufficient documentation

## 2022-09-29 MED ORDER — CYCLOBENZAPRINE HCL 10 MG PO TABS: 10.0000 mg | ORAL_TABLET | Freq: Two times a day (BID) | ORAL | 0 refills | Status: AC | PRN

## 2022-09-29 MED ORDER — DEXAMETHASONE 4 MG PO TABS
10.0000 mg | ORAL_TABLET | Freq: Once | ORAL | Status: AC
  Administered 2022-09-29: 10 mg via ORAL
  Filled 2022-09-29: qty 3

## 2022-09-29 MED ORDER — ACETAMINOPHEN 500 MG PO TABS
1000.0000 mg | ORAL_TABLET | Freq: Once | ORAL | Status: AC
  Administered 2022-09-29: 1000 mg via ORAL
  Filled 2022-09-29: qty 2

## 2022-09-29 MED ORDER — OXYCODONE HCL 5 MG PO TABS
5.0000 mg | ORAL_TABLET | Freq: Once | ORAL | Status: AC
  Administered 2022-09-29: 5 mg via ORAL
  Filled 2022-09-29: qty 1

## 2022-09-29 MED ORDER — OXYCODONE HCL 5 MG PO TABS: 5.0000 mg | ORAL_TABLET | ORAL | 0 refills | Status: AC | PRN

## 2022-09-29 MED ORDER — KETOROLAC TROMETHAMINE 60 MG/2ML IM SOLN
60.0000 mg | Freq: Once | INTRAMUSCULAR | Status: AC
  Administered 2022-09-29: 60 mg via INTRAMUSCULAR
  Filled 2022-09-29: qty 2

## 2022-09-29 NOTE — ED Provider Notes (Signed)
[place] EMERGENCY DEPARTMENT AT [facility] Provider Note   CSN: [id number] Arrival date & time: 09/29/22  1856     History {Add pertinent medical, surgical, social history, OB history to HPI:1} Chief Complaint  Patient presents with   Pelvic Pain    Simya Tercero is a 77 y.o. female.  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  Pain management had recommended surgical evaluation  Home Medications Prior to Admission medications   Medication Sig Start Date End Date Taking? Authorizing Provider  ALPRAZolam Prudy Feeler) 0.5 MG tablet Take 0.5 mg by mouth at bedtime as needed. For sleep    [provider]  amLODipine (NORVASC) 5 MG tablet Take 5 mg by mouth daily.      [provider]  atorvastatin (LIPITOR) 10 MG tablet Take 10 mg by mouth daily.    [provider]  azithromycin (ZITHROMAX) 250 MG tablet Take 1 tablet (250 mg total) by mouth daily. Take first 2 tablets together, then 1 every day until finished. 05/05/22   Terrilee Files, MD  benzonatate (TESSALON) 100 MG capsule Take 1 capsule (100 mg total) by mouth every 8 (eight) hours. 05/05/18   Petrucelli, Samantha R, PA-C  celecoxib (CELEBREX) 200 MG capsule Take 200 mg by mouth 2 (two) times daily.    [provider]  cephALEXin (KEFLEX) 500 MG capsule Take 1 capsule (500 mg total) by mouth 2 (two) times daily. 09/12/17   Rolan Bucco, MD  chlorpheniramine-HYDROcodone (TUSSIONEX) 10-8 MG/5ML Take 5 mLs by mouth at bedtime as needed for cough. 05/05/22   Terrilee Files, MD  cyclobenzaprine (FLEXERIL) 5 MG tablet Take 5 mg by mouth 3 (three) times daily as needed  for muscle spasms.    [provider]  desvenlafaxine (PRISTIQ) 50 MG 24 hr tablet Take 50 mg by mouth daily.      [provider]  diclofenac sodium (VOLTAREN) 1 % GEL Apply topically 4 (four) times daily.    [provider]  DULoxetine (CYMBALTA) 60 MG capsule Take 60 mg by mouth daily.    [provider]  fluticasone (FLONASE) 50 MCG/ACT nasal spray Place 1 spray into both nostrils daily. 05/05/18   Petrucelli, Samantha R, PA-C  gabapentin (NEURONTIN) 100 MG capsule Take 100 mg by mouth daily.     [provider]  methocarbamol (ROBAXIN) 500 MG tablet Take 1 tablet (500 mg total) by mouth 2 (two) times daily. 12/02/19   Mannie Stabile, PA-C  omeprazole (PRILOSEC) 20 MG capsule Take 20 mg by mouth daily.      [provider]  ondansetron (ZOFRAN-ODT) 4 MG disintegrating tablet Take 4 mg by mouth every 8 (eight) hours as needed. For nausea    [provider]  predniSONE (STERAPRED UNI-PAK) 10 MG tablet Take 10 mg by mouth daily.      [provider]  rOPINIRole (REQUIP) 0.5 MG tablet Take 0.5 mg by mouth daily as needed. For restless legs    [provider]  sertraline (ZOLOFT) 100 MG tablet Take 100 mg by mouth daily.    [provider]  sulfamethoxazole-trimethoprim (BACTRIM DS,SEPTRA DS) 800-160 MG tablet Take 1  tablet by mouth 2 (two) times daily.    [provider]  traMADol (ULTRAM) 50 MG tablet Take 50 mg by mouth every 6 (six) hours as needed. For pain    [provider]  zolpidem (AMBIEN) 5 MG tablet Take 5 mg by mouth at bedtime as needed for sleep.    [provider]      Allergies    Macrobid [nitrofurantoin]    Review of Systems   Review of Systems  Physical Exam Updated Vital Signs BP 136/80 (BP Location: Left Arm)   Pulse 82   Temp 97.7 F (36.5 C)   Resp 20   Ht  (1.6 m)   Wt 113.4 kg   SpO2 95%   BMI 44.29 kg/m  Physical Exam  ED Results  / Procedures / Treatments   Labs (all labs ordered are listed, but only abnormal results are displayed) Labs Reviewed - No data to display  EKG None  Radiology No results found.  Procedures Procedures  {Document cardiac monitor, telemetry assessment procedure when appropriate:1}  Medications Ordered in ED Medications - No data to display  ED Course/ Medical Decision Making/ A&P   {   Click here for ABCD2, HEART and other calculatorsREFRESH Note before signing :1}                          Medical Decision Making  ***  {Document critical care time when appropriate:1} {Document review of labs and clinical decision tools ie heart score, Chads2Vasc2 etc:1}  {Document your independent review of radiology images, and any outside records:1} {Document your discussion with family members, caretakers, and with consultants:1} {Document social determinants of health affecting pt's care:1} {Document your decision making why or why not admission, treatments were needed:1} Final Clinical Impression(s) / ED Diagnoses Final diagnoses:  None    Rx / DC Orders ED Discharge Orders     None

## 2022-09-29 NOTE — Discharge Instructions (Addendum)
You may have a muscular strain such as an adductor muscle strain or pain related to your back. For your pain, you may take up to  of acetaminophen (tylenol) 4 times daily for up to a week. This is the maximum dose of acetminophen (tylenol) you can take from all sources. Please check other over-the-counter medications and prescriptions to ensure you are not taking other medications that contain acetaminophen.  You may also take ibuprofen 400 mg 6 times a day OR  4 times a day alternating with or at the same time as tylenol.  Take oxycodone as needed for breakthrough pain.  This medication can be addicting, sedating and cause constipation.   You can instead of taking the oxycodone, take the muscle relaxant flexeril and also consider a lidocaine patch.

## 2022-09-29 NOTE — ED Triage Notes (Signed)
Pt states pain in pelvis and radiating to knee that started on Saturday after pulling weeds  States stretched to far forward and felt a pop  Pt seen by ortho surgeon today and xrays of spine  Was sent here for MRI of hip/ further eval  Pain worse with walking   Took oxycodone at 1600

## 2023-03-19 ENCOUNTER — Other Ambulatory Visit: Payer: Self-pay

## 2023-03-19 ENCOUNTER — Encounter (HOSPITAL_BASED_OUTPATIENT_CLINIC_OR_DEPARTMENT_OTHER): Payer: Self-pay | Admitting: Pediatrics

## 2023-03-19 ENCOUNTER — Emergency Department (HOSPITAL_BASED_OUTPATIENT_CLINIC_OR_DEPARTMENT_OTHER): Payer: Medicare HMO

## 2023-03-19 ENCOUNTER — Emergency Department (HOSPITAL_BASED_OUTPATIENT_CLINIC_OR_DEPARTMENT_OTHER)
Admission: EM | Admit: 2023-03-19 | Discharge: 2023-03-19 | Disposition: A | Discharge status: Home / Self Care | Payer: Medicare HMO | Attending: Emergency Medicine | Admitting: Emergency Medicine

## 2023-03-19 DIAGNOSIS — W010XXA Fall on same level from slipping, tripping and stumbling without subsequent striking against object, initial encounter: Secondary | ICD-10-CM | POA: Insufficient documentation

## 2023-03-19 DIAGNOSIS — S46911A Strain of unspecified muscle, fascia and tendon at shoulder and upper arm level, right arm, initial encounter: Secondary | ICD-10-CM | POA: Diagnosis not present

## 2023-03-19 DIAGNOSIS — S4991XA Unspecified injury of right shoulder and upper arm, initial encounter: Secondary | ICD-10-CM | POA: Diagnosis present

## 2023-03-19 MED ORDER — DICLOFENAC SODIUM 1 % EX GEL: 2.0000 g | Freq: Four times a day (QID) | CUTANEOUS | 0 refills | Status: AC

## 2023-03-19 MED ORDER — OXYCODONE-ACETAMINOPHEN 5-325 MG PO TABS
1.0000 | ORAL_TABLET | Freq: Once | ORAL | Status: AC
  Administered 2023-03-19: 1 via ORAL
  Filled 2023-03-19: qty 1

## 2023-03-19 NOTE — Discharge Instructions (Addendum)
You have been evaluated for your injury.  X-ray of your right shoulder did not show any broken bone or dislocation.  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  You may use a sling at home as needed for support but do not wear it for any prolonged period of time as it can increase risk of developing frozen shoulder.  Please apply Voltaren gel to the shoulder and massage treatment 4 times daily for the next several days for pain control.  Follow-up with orthopedic specialist as needed.

## 2023-03-19 NOTE — ED Triage Notes (Signed)
C/O right shoulder pain after a fall night before last.

## 2023-03-19 NOTE — ED Provider Notes (Signed)
[facility] [facility] Provider Note   CSN: [id number] Arrival date & time: 03/19/23  1549     History  Chief Complaint  Patient presents with   Shoulder Pain    Denise Little is a 77 y.o. female.  The history is provided by the patient, the spouse and medical records. No language interpreter was used.  Shoulder Pain    77 year old female significant history of hypertension presenting for evaluation of shoulder injury.  Patient reports 2 days ago she was in the process of trying to step onto a truck.  She has her right foot onto the foot lever of the truck and when she pushed off with her left foot she slipped and fell landing on her right hip but also striking her right shoulder and her head against the edge of the truck.  She denies any loss of consciousness.  She was able to get up on her own but states since then she has had persistent pain about her right shoulder.  Pain is sharp throbbing radiates to the right side of her neck worse with lifting up her shoulder.  Pain is not out of control despite using heat and taking her home medication including Percocet.  She does not endorse any significant headache denies any significant hip pain she is not on any blood thinner medication she denies any numbness or weakness and she denies any precipitating symptoms prior to the fall.  No complaints of elbow or wrist pain.  Home Medications Prior to Admission medications   Medication Sig Start Date End Date Taking? Authorizing Provider  ALPRAZolam Prudy Feeler) 0.5 MG tablet Take 0.5 mg by mouth at bedtime as needed. For sleep    [provider]  amLODipine (NORVASC) 5 MG tablet Take 5 mg by mouth daily.      [provider]  atorvastatin (LIPITOR) 10 MG tablet Take 10 mg by mouth daily.    [provider]  azithromycin (ZITHROMAX) 250 MG tablet Take 1 tablet (250 mg total) by mouth daily. Take first 2 tablets together, then 1 every day  until finished. 05/05/22   Terrilee Files, MD  benzonatate (TESSALON) 100 MG capsule Take 1 capsule (100 mg total) by mouth every 8 (eight) hours. 05/05/18   Petrucelli, Samantha R, PA-C  celecoxib (CELEBREX) 200 MG capsule Take 200 mg by mouth 2 (two) times daily.    [provider]  cephALEXin (KEFLEX) 500 MG capsule Take 1 capsule (500 mg total) by mouth 2 (two) times daily. 09/12/17   Rolan Bucco, MD  chlorpheniramine-HYDROcodone (TUSSIONEX) 10-8 MG/5ML Take 5 mLs by mouth at bedtime as needed for cough. 05/05/22   Terrilee Files, MD  cyclobenzaprine (FLEXERIL) 10 MG tablet Take 1 tablet (10 mg total) by mouth 2 (two) times daily as needed for muscle spasms. 09/29/22   Alvira Monday, MD  desvenlafaxine (PRISTIQ) 50 MG 24 hr tablet Take 50 mg by mouth daily.      [provider]  diclofenac sodium (VOLTAREN) 1 % GEL Apply topically 4 (four) times daily.    [provider]  DULoxetine (CYMBALTA) 60 MG capsule Take 60 mg by mouth daily.    [provider]  fluticasone (FLONASE) 50 MCG/ACT nasal spray Place 1 spray into both nostrils daily. 05/05/18   Petrucelli, Samantha R, PA-C  gabapentin (NEURONTIN) 100 MG capsule Take 100 mg by mouth daily.     [provider]  methocarbamol (ROBAXIN) 500 MG tablet Take 1  tablet (500 mg total) by mouth 2 (two) times daily. 12/02/19   Mannie Stabile, PA-C  omeprazole (PRILOSEC) 20 MG capsule Take 20 mg by mouth daily.      [provider]  ondansetron (ZOFRAN-ODT) 4 MG disintegrating tablet Take 4 mg by mouth every 8 (eight) hours as needed. For nausea    [provider]  oxyCODONE (ROXICODONE) 5 MG immediate release tablet Take 1 tablet (5 mg total) by mouth every 4 (four) hours as needed for severe pain. 09/29/22   Alvira Monday, MD  predniSONE (STERAPRED UNI-PAK) 10 MG tablet Take 10 mg by mouth daily.      [provider]  rOPINIRole (REQUIP) 0.5 MG tablet Take 0.5 mg by  mouth daily as needed. For restless legs    [provider]  sertraline (ZOLOFT) 100 MG tablet Take 100 mg by mouth daily.    [provider]  sulfamethoxazole-trimethoprim (BACTRIM DS,SEPTRA DS) 800-160 MG tablet Take 1 tablet by mouth 2 (two) times daily.    [provider]  zolpidem (AMBIEN) 5 MG tablet Take 5 mg by mouth at bedtime as needed for sleep.    [provider]      Allergies    Macrobid [nitrofurantoin]    Review of Systems   Review of Systems  All other systems reviewed and are negative.   Physical Exam Updated Vital Signs BP (!) 147/81 (BP Location: Left Arm)   Pulse 85   Temp 99.2 F (37.3 C) (Oral)   Resp 20   Ht 5\' 3"  (1.6 m)   Wt 113.4 kg   SpO2 96%   BMI 44.29 kg/m  Physical Exam Vitals and nursing note reviewed.  Constitutional:      General: She is not in acute distress.    Appearance: She is well-developed. She is obese.  HENT:     Head: Atraumatic.  Eyes:     Conjunctiva/sclera: Conjunctivae normal.  Pulmonary:     Effort: Pulmonary effort is normal.  Musculoskeletal:        General: Tenderness (Right shoulder: Tenderness noted to the lateral deltoid and axillary region with decreased shoulder range of motion secondary to pain but no obvious deformity noted.  No tenderness along the clavicle.) present.     Cervical back: Neck supple.     Comments: Neck with full range of motion, no cervical midline spine tenderness.  Right elbow and right wrist nontender, normal grip strength.  Skin:    Findings: No rash.  Neurological:     Mental Status: She is alert. Mental status is at baseline.  Psychiatric:        Mood and Affect: Mood normal.     ED Results / Procedures / Treatments   Labs (all labs ordered are listed, but only abnormal results are displayed) Labs Reviewed - No data to display  EKG None  Radiology DG Shoulder Right  Result Date: 03/19/2023 CLINICAL DATA:  Fall, pain EXAM: RIGHT SHOULDER  - 2+ VIEW COMPARISON:  01/02/2021 FINDINGS: There is no evidence of fracture or dislocation. Mild-to-moderate osteoarthritic changes of the glenohumeral and acromioclavicular joints. Soft tissues are unremarkable. IMPRESSION: 1. No acute fracture or dislocation of the right shoulder. 2. Mild-to-moderate osteoarthritic changes. Electronically Signed   By: Duanne Guess D.O.   On: 03/19/2023 18:25    Procedures Procedures    Medications Ordered in ED Medications  oxyCODONE-acetaminophen (PERCOCET/ROXICET) 5-325 MG per tablet 1 tablet (1 tablet Oral Given 03/19/23 1737)    ED  Course/ Medical Decision Making/ A&P                                 Medical Decision Making Amount and/or Complexity of Data Reviewed Radiology: ordered.  Risk Prescription drug management.   BP (!) 147/81 (BP Location: Left Arm)   Pulse 85   Temp 99.2 F (37.3 C) (Oral)   Resp 20   Ht 5\' 3"  (1.6 m)   Wt 113.4 kg   SpO2 96%   BMI 44.29 kg/m   54:14 PM  77 year old female significant history of hypertension presenting for evaluation of shoulder injury.  Patient reports 2 days ago she was in the process of trying to step onto a truck.  She has her right foot onto the foot lever of the truck and when she pushed off with her left foot she slipped and fell landing on her right hip but also striking her right shoulder and her head against the edge of the truck.  She denies any loss of consciousness.  She was able to get up on her own but states since then she has had persistent pain about her right shoulder.  Pain is sharp throbbing radiates to the right side of her neck worse with lifting up her shoulder.  Pain is not out of control despite using heat and taking her home medication including Percocet.  She does not endorse any significant headache denies any significant hip pain she is not on any blood thinner medication she denies any numbness or weakness and she denies any precipitating symptoms prior to the  fall.  No complaints of elbow or wrist pain.  On exam this is an obese female resting comfortably in bed appears to be in no acute discomfort.  She does have tenderness about her right elbow most significant to her deltoid and decreased range of motion secondary to pain.  Pain with both active and passive range of motion, no obvious deformity noted.  No overlying skin changes.  No other reproducible tenderness.  Vitals are notable for mildly elevated blood pressure of 147/81.  X-ray of right shoulder is ordered.  Patient given Percocet for pain with improvement of sxs.  Care discussed with Dr. Eloise Harman  6:35 PM X-ray of the right shoulder independently viewed to read by me and showed no acute changes except for some osteoarthritis.  Agree with radiology interpretation.  Will provide patient with sling, supportive care, outpatient follow-up with orthopedist as needed.  Encouraged patient to not wear the sling for any prolonged period of time to decrease the risk of adhesive capsulitis.        Final Clinical Impression(s) / ED Diagnoses Final diagnoses:  Strain of right shoulder, initial encounter    Rx / DC Orders ED Discharge Orders          Ordered    diclofenac Sodium (VOLTAREN) 1 % GEL  4 times daily        03/19/23 1839              Fayrene Helper, PA-C 03/19/23 1841    Rondel Baton, MD 03/20/23 [phone]

## 2023-10-21 ENCOUNTER — Other Ambulatory Visit: Payer: Self-pay | Admitting: Family Medicine

## 2023-10-21 NOTE — Telephone Encounter (Signed)
 Prescription Request  10/21/2023  LOV: Visit date not found  What is the name of the medication or equipment? DULoxetine (CYMBALTA) 60 MG capsule   Have you contacted your pharmacy to request a refill? Yes   Which pharmacy would you like this sent to?  [facility] [id number] - [place], [place] - [address] [place] [address] Phone: [phone] Fax: [phone]    Patient notified that their request is being sent to the clinical staff for review and that they should receive a response within 2 business days.   Please advise at [facility] [phone]

## 2023-10-22 NOTE — Telephone Encounter (Signed)
 Requested medication (s) are due for refill today - unknown  Requested medication (s) are on the active medication list -yes  Future visit scheduled -no  Last refill: listed as historical  Notes to clinic: may be previous patient- no record of care at [facility]  Requested Prescriptions  Pending Prescriptions Disp Refills   DULoxetine (CYMBALTA) 60 MG capsule      Sig: Take 1 capsule (60 mg total) by mouth daily.     Psychiatry: Antidepressants - SNRI - duloxetine Failed - 10/22/2023  2:45 PM      Failed - Cr in normal range and within 360 days    Creatinine, Ser  Date Value Ref Range Status  12/02/2019 0.74 0.44 - 1.00 mg/dL Final         Failed - eGFR is 30 or above and within 360 days    GFR calc Af Amer  Date Value Ref Range Status  12/02/2019 >60 >60 mL/min Final   GFR calc non Af Amer  Date Value Ref Range Status  12/02/2019 >60 >60 mL/min Final         Failed - Completed PHQ-2 or PHQ-9 in the last 360 days      Failed - Last BP in normal range    BP Readings from Last 1 Encounters:  03/19/23 (!) 166/86         Failed - Valid encounter within last 6 months    Recent Outpatient Visits   None               Requested Prescriptions  Pending Prescriptions Disp Refills   DULoxetine (CYMBALTA) 60 MG capsule      Sig: Take 1 capsule (60 mg total) by mouth daily.     Psychiatry: Antidepressants - SNRI - duloxetine Failed - 10/22/2023  2:45 PM      Failed - Cr in normal range and within 360 days    Creatinine, Ser  Date Value Ref Range Status  12/02/2019 0.74 0.44 - 1.00 mg/dL Final         Failed - eGFR is 30 or above and within 360 days    GFR calc Af Amer  Date Value Ref Range Status  12/02/2019 >60 >60 mL/min Final   GFR calc non Af Amer  Date Value Ref Range Status  12/02/2019 >60 >60 mL/min Final         Failed - Completed PHQ-2 or PHQ-9 in the last 360 days      Failed - Last BP in normal range    BP Readings from Last 1 Encounters:  03/19/23  (!) 166/86         Failed - Valid encounter within last 6 months    Recent Outpatient Visits   None

## 2024-04-03 ENCOUNTER — Other Ambulatory Visit: Payer: Self-pay | Admitting: Emergency Medicine

## 2024-04-04 ENCOUNTER — Other Ambulatory Visit: Payer: Self-pay | Admitting: Emergency Medicine

## 2024-06-27 ENCOUNTER — Telehealth: Payer: Self-pay

## 2024-06-27 NOTE — Telephone Encounter (Signed)
 Copied from CRM [id number] 2022. Topic: Appointments - Scheduling Inquiry for Clinic >> Jun 27, 2024  2:06 PM Eva FALCON wrote: Reason for CRM: Pt called in and was requesting to speak to Dr. Aletha. 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 States she does not like the doctors she was left with and wants to continue care with Dr. Aletha. She is scheduled for June 1st and the appointment is on a wait list. Pt is hopeful that Dr. Aletha would be willing to make exception and squeeze her in sooner. Please call back and  advise [phone] .

## 2024-11-06 ENCOUNTER — Ambulatory Visit: Payer: PRIVATE HEALTH INSURANCE | Admitting: Family Medicine
